# Patient Record
Sex: Female | Born: 1945 | Race: White | Hispanic: No | Marital: Single | Smoking: Former smoker
Health system: Southern US, Community
[De-identification: ages and names within clinical notes are randomized; demographics above are authoritative.]

## PROBLEM LIST (undated history)

## (undated) DIAGNOSIS — K219 Gastro-esophageal reflux disease without esophagitis: Secondary | ICD-10-CM

## (undated) DIAGNOSIS — I499 Cardiac arrhythmia, unspecified: Secondary | ICD-10-CM

## (undated) DIAGNOSIS — C801 Malignant (primary) neoplasm, unspecified: Secondary | ICD-10-CM

## (undated) DIAGNOSIS — M1990 Unspecified osteoarthritis, unspecified site: Secondary | ICD-10-CM

## (undated) DIAGNOSIS — T8859XA Other complications of anesthesia, initial encounter: Secondary | ICD-10-CM

## (undated) DIAGNOSIS — E079 Disorder of thyroid, unspecified: Secondary | ICD-10-CM

## (undated) DIAGNOSIS — T4145XA Adverse effect of unspecified anesthetic, initial encounter: Secondary | ICD-10-CM

## (undated) DIAGNOSIS — H409 Unspecified glaucoma: Secondary | ICD-10-CM

## (undated) DIAGNOSIS — G709 Myoneural disorder, unspecified: Secondary | ICD-10-CM

## (undated) DIAGNOSIS — E041 Nontoxic single thyroid nodule: Secondary | ICD-10-CM

## (undated) DIAGNOSIS — I10 Essential (primary) hypertension: Secondary | ICD-10-CM

## (undated) HISTORY — DX: Disorder of thyroid, unspecified: E07.9

## (undated) HISTORY — DX: Gastro-esophageal reflux disease without esophagitis: K21.9

## (undated) HISTORY — PX: COLONOSCOPY: SHX174

## (undated) HISTORY — DX: Nontoxic single thyroid nodule: E04.1

## (undated) HISTORY — DX: Unspecified osteoarthritis, unspecified site: M19.90

## (undated) HISTORY — DX: Essential (primary) hypertension: I10

---
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
## 1999-02-25 ENCOUNTER — Encounter: Payer: Self-pay | Admitting: Obstetrics & Gynecology

## 1999-02-25 ENCOUNTER — Ambulatory Visit (HOSPITAL_COMMUNITY): Admission: RE | Admit: 1999-02-25 | Discharge: 1999-02-25 | Payer: Self-pay | Admitting: Obstetrics & Gynecology

## 1999-03-08 ENCOUNTER — Other Ambulatory Visit: Admission: RE | Admit: 1999-03-08 | Discharge: 1999-03-08 | Payer: Self-pay | Admitting: Obstetrics & Gynecology

## 1999-09-01 ENCOUNTER — Encounter: Admission: RE | Admit: 1999-09-01 | Discharge: 1999-09-01 | Payer: Self-pay | Admitting: Obstetrics & Gynecology

## 1999-09-01 ENCOUNTER — Encounter: Payer: Self-pay | Admitting: Obstetrics & Gynecology

## 2001-03-20 ENCOUNTER — Emergency Department (HOSPITAL_COMMUNITY): Admission: EM | Admit: 2001-03-20 | Discharge: 2001-03-20 | Payer: Self-pay | Admitting: Emergency Medicine

## 2001-03-20 ENCOUNTER — Encounter: Payer: Self-pay | Admitting: Emergency Medicine

## 2004-01-21 ENCOUNTER — Encounter: Admission: RE | Admit: 2004-01-21 | Discharge: 2004-01-21 | Payer: Self-pay | Admitting: Endocrinology

## 2004-08-29 ENCOUNTER — Encounter: Admission: RE | Admit: 2004-08-29 | Discharge: 2004-08-29 | Payer: Self-pay | Admitting: Orthopaedic Surgery

## 2004-09-02 ENCOUNTER — Encounter: Admission: RE | Admit: 2004-09-02 | Discharge: 2004-09-02 | Payer: Self-pay | Admitting: Orthopaedic Surgery

## 2004-09-06 ENCOUNTER — Encounter: Admission: RE | Admit: 2004-09-06 | Discharge: 2004-09-06 | Payer: Self-pay | Admitting: Obstetrics & Gynecology

## 2004-09-20 ENCOUNTER — Other Ambulatory Visit: Admission: RE | Admit: 2004-09-20 | Discharge: 2004-09-20 | Payer: Self-pay | Admitting: Obstetrics & Gynecology

## 2009-12-31 ENCOUNTER — Encounter: Admission: RE | Admit: 2009-12-31 | Discharge: 2009-12-31 | Payer: Self-pay | Admitting: Endocrinology

## 2010-01-28 ENCOUNTER — Encounter: Admission: RE | Admit: 2010-01-28 | Discharge: 2010-01-28 | Payer: Self-pay | Admitting: Obstetrics & Gynecology

## 2010-02-01 ENCOUNTER — Encounter: Admission: RE | Admit: 2010-02-01 | Discharge: 2010-02-01 | Payer: Self-pay | Admitting: Obstetrics & Gynecology

## 2010-03-16 ENCOUNTER — Encounter (INDEPENDENT_AMBULATORY_CARE_PROVIDER_SITE_OTHER): Payer: Self-pay | Admitting: Obstetrics and Gynecology

## 2010-03-16 ENCOUNTER — Inpatient Hospital Stay (HOSPITAL_COMMUNITY): Admission: RE | Admit: 2010-03-16 | Discharge: 2010-03-19 | Payer: Self-pay | Admitting: Obstetrics and Gynecology

## 2010-03-24 HISTORY — PX: ABDOMINAL HYSTERECTOMY: SHX81

## 2010-08-14 ENCOUNTER — Encounter: Payer: Self-pay | Admitting: Orthopaedic Surgery

## 2010-09-19 ENCOUNTER — Other Ambulatory Visit: Payer: Self-pay | Admitting: Family Medicine

## 2010-09-19 DIAGNOSIS — E049 Nontoxic goiter, unspecified: Secondary | ICD-10-CM

## 2010-09-19 DIAGNOSIS — R10811 Right upper quadrant abdominal tenderness: Secondary | ICD-10-CM

## 2010-09-22 ENCOUNTER — Ambulatory Visit
Admission: RE | Admit: 2010-09-22 | Discharge: 2010-09-22 | Disposition: A | Discharge status: Home / Self Care | Payer: BC Managed Care – PPO | Source: Ambulatory Visit | Attending: Family Medicine | Admitting: Family Medicine

## 2010-09-22 DIAGNOSIS — R10811 Right upper quadrant abdominal tenderness: Secondary | ICD-10-CM

## 2010-09-22 DIAGNOSIS — E049 Nontoxic goiter, unspecified: Secondary | ICD-10-CM

## 2010-09-28 ENCOUNTER — Other Ambulatory Visit: Payer: Self-pay | Admitting: Family Medicine

## 2010-09-28 DIAGNOSIS — E041 Nontoxic single thyroid nodule: Secondary | ICD-10-CM

## 2010-10-07 LAB — COMPREHENSIVE METABOLIC PANEL
ALT: 20 U/L (ref 0–35)
AST: 22 U/L (ref 0–37)
Albumin: 3.4 g/dL — ABNORMAL LOW (ref 3.5–5.2)
Alkaline Phosphatase: 50 U/L (ref 39–117)
Creatinine, Ser: 0.77 mg/dL (ref 0.4–1.2)
GFR calc Af Amer: >60 mL/min (ref >60)
GFR calc non Af Amer: >60 mL/min (ref >60)
Potassium: 4.6 mEq/L (ref 3.5–5.1)
Sodium: 141 mEq/L (ref 135–145)
Total Bilirubin: 1 mg/dL (ref 0.3–1.2)

## 2010-10-07 LAB — CBC
HCT: 33.6 % — ABNORMAL LOW (ref 36.0–46.0)
HCT: 35.5 % — ABNORMAL LOW (ref 36.0–46.0)
HCT: 41.5 % (ref 36.0–46.0)
Hemoglobin: 11.3 g/dL — ABNORMAL LOW (ref 12.0–15.0)
Hemoglobin: 13.6 g/dL (ref 12.0–15.0)
MCHC: 32.9 g/dL (ref 30.0–36.0)
MCHC: 33.8 g/dL (ref 30.0–36.0)
MCV: 90.6 fL (ref 78.0–100.0)
Platelets: 224 10*3/uL (ref 150–400)
RBC: 3.71 MIL/uL — ABNORMAL LOW (ref 3.87–5.11)
RBC: 3.93 MIL/uL (ref 3.87–5.11)
WBC: 4.9 10*3/uL (ref 4.0–10.5)

## 2010-10-07 LAB — HEMOGLOBIN AND HEMATOCRIT, BLOOD: Hemoglobin: 13.5 g/dL (ref 12.0–15.0)

## 2010-10-07 LAB — SURGICAL PCR SCREEN: MRSA, PCR: NEGATIVE

## 2010-10-11 ENCOUNTER — Other Ambulatory Visit (HOSPITAL_COMMUNITY)
Admission: RE | Admit: 2010-10-11 | Discharge: 2010-10-11 | Disposition: A | Discharge status: Home / Self Care | Payer: BC Managed Care – PPO | Source: Ambulatory Visit | Attending: Interventional Radiology | Admitting: Interventional Radiology

## 2010-10-11 ENCOUNTER — Ambulatory Visit
Admission: RE | Admit: 2010-10-11 | Discharge: 2010-10-11 | Disposition: A | Discharge status: Home / Self Care | Payer: BC Managed Care – PPO | Source: Ambulatory Visit | Attending: Family Medicine | Admitting: Family Medicine

## 2010-10-11 ENCOUNTER — Other Ambulatory Visit: Payer: Self-pay | Admitting: Interventional Radiology

## 2010-10-11 DIAGNOSIS — E049 Nontoxic goiter, unspecified: Secondary | ICD-10-CM | POA: Insufficient documentation

## 2010-10-11 DIAGNOSIS — E041 Nontoxic single thyroid nodule: Secondary | ICD-10-CM

## 2011-12-12 ENCOUNTER — Other Ambulatory Visit: Payer: Self-pay | Admitting: Obstetrics & Gynecology

## 2011-12-12 DIAGNOSIS — N6452 Nipple discharge: Secondary | ICD-10-CM

## 2011-12-20 ENCOUNTER — Other Ambulatory Visit: Payer: Self-pay | Admitting: Obstetrics & Gynecology

## 2011-12-20 ENCOUNTER — Ambulatory Visit
Admission: RE | Admit: 2011-12-20 | Discharge: 2011-12-20 | Disposition: A | Discharge status: Home / Self Care | Payer: BC Managed Care – PPO | Source: Ambulatory Visit | Attending: Obstetrics & Gynecology | Admitting: Obstetrics & Gynecology

## 2011-12-20 DIAGNOSIS — N6452 Nipple discharge: Secondary | ICD-10-CM

## 2011-12-21 ENCOUNTER — Encounter (INDEPENDENT_AMBULATORY_CARE_PROVIDER_SITE_OTHER): Payer: Self-pay | Admitting: General Surgery

## 2011-12-21 ENCOUNTER — Ambulatory Visit (INDEPENDENT_AMBULATORY_CARE_PROVIDER_SITE_OTHER): Payer: BC Managed Care – PPO | Admitting: General Surgery

## 2011-12-21 VITALS — BP 150/86 | HR 77 | Temp 97.7°F | Ht 66.0 in | Wt 160.0 lb

## 2011-12-21 DIAGNOSIS — N6459 Other signs and symptoms in breast: Secondary | ICD-10-CM

## 2011-12-21 DIAGNOSIS — N6452 Nipple discharge: Secondary | ICD-10-CM | POA: Insufficient documentation

## 2011-12-21 NOTE — Progress Notes (Signed)
Patient ID: Susan Ali, female   DOB: 04/09/1946, 65 y.o.   MRN: [id number]  Chief Complaint  Patient presents with  . Pre-op Exam    eval br    HPI Susan Ali is a 65 y.o. female.  Referred by Dr. Eagle HPI 65 yof who is otherwise healthy and presents with several episodes over months with dark spots noted on her nightgown and her sheets.  This has been brownish fluid.  She has no masses present and no prior history of any breast complaints.  She underwent attempted ductogram and mmg and is referred for evaluation.  Past Medical History  Diagnosis Date  . Arthritis   . Hypertension   . Thyroid disease     Past Surgical History  Procedure Date  . Knee surgery 1967    right  . Abdominal hysterectomy 03/2010    Family History  Problem Relation Age of Onset  . Stroke Mother   . Hypertension Father     Social History History  Substance Use Topics  . Smoking status: Former Smoker  . Smokeless tobacco: Former User    Quit date: 12/21/1991  . Alcohol Use: No    No Known Allergies  Current Outpatient Prescriptions  Medication Sig Dispense Refill  . Multiple Vitamin (MULTIVITAMIN) capsule Take 1 capsule by mouth daily.      . ramipril (ALTACE) 10 MG capsule Take 10 mg by mouth daily.      . timolol (TIMOPTIC) 0.5 % ophthalmic solution Place 1 drop into both eyes daily.        Review of Systems Review of Systems  Constitutional: Negative for fever, chills and unexpected weight change.  HENT: Negative for hearing loss, congestion, sore throat, trouble swallowing and voice change.   Eyes: Negative for visual disturbance.  Respiratory: Negative for cough and wheezing.   Cardiovascular: Negative for chest pain, palpitations and leg swelling.  Gastrointestinal: Negative for nausea, vomiting, abdominal pain, diarrhea, constipation, blood in stool, abdominal distention and anal bleeding.  Genitourinary: Negative for hematuria, vaginal bleeding and difficulty  urinating.  Musculoskeletal: Negative for arthralgias.  Skin: Negative for rash and wound.  Neurological: Negative for seizures, syncope and headaches.  Hematological: Negative for adenopathy. Does not bruise/bleed easily.  Psychiatric/Behavioral: Negative for confusion.    Blood pressure 150/86, pulse 77, temperature 97.7 F (36.5 C), temperature source Temporal, height 5' 6" (1.676 m), weight 160 lb (72.576 kg), SpO2 97.00%.  Physical Exam Physical Exam  Vitals reviewed. Constitutional: She appears well-developed and well-nourished.  Neck: Neck supple. Thyromegaly present.  Cardiovascular: Normal rate, regular rhythm and normal heart sounds.   Pulmonary/Chest: Effort normal and breath sounds normal. She has no wheezes. She has no rales. Right breast exhibits nipple discharge (dark nipple dc from single duct). Right breast exhibits no inverted nipple, no mass, no skin change and no tenderness. Left breast exhibits no inverted nipple, no mass, no nipple discharge, no skin change and no tenderness. Breasts are symmetrical.  Lymphadenopathy:    She has no cervical adenopathy.    She has no axillary adenopathy.       Right: No supraclavicular adenopathy present.       Left: No supraclavicular adenopathy present.    Data Reviewed DIGITAL DIAGNOSTIC BILATERAL MAMMOGRAM WITH CAD AND RIGHT DUCTOGRAM  [facility]  Findings: There are scattered fibroglandular densities. There is  no suspicious dominant mass, architectural distortion or  calcification to   suggest malignancy. Previously noted cyst in the  right anterior upper outer quadrant has decreased in size as  compared to prior study.  Mammographic images were processed with CAD.  On physical examination, there is a dark yellow to slightly blood  tinged to brown spontaneous discharge arising from a duct in the  lower inner quadrant of the  right nipple.  Galactography is performed using a 30 gauge probe and 0.2 ccs of  Omnipaque. Images demonstrate a 1mm slightly irregular filling  defect (not as smooth as one would expect for an air bubble)  approximately 18 mm deep to the nipple within the discharging duct.  The wall of the duct is slightly irregular approximately 32 mm deep  to the nipple which is a concerning finding. Surgical consultation  for consideration of excision is recommended.  IMPRESSION:  Small filling defect in the discharging duct approximately 18 mm  deep to the right nipple with slight irregularity of the wall of  the same duct approximately 32 mm deep to the nipple. Surgical  consultation with consideration of excision is recommended.  Surgical consultation was scheduled with Dr. Darnell Jeschke for  12/21/2011.  BI-RADS CATEGORY 4: Suspicious abnormality - biopsy should be  considered.   Assessment    Right breast nipple discharge    Plan    She has unilateral, spontaneous dark discharge with abnl ductogram.  We discussed a right breast duct excision.  Risks include bleeding, infection or further surgery.        Haja Crego 12/21/2011, 2:28 PM    

## 2012-01-11 ENCOUNTER — Encounter (HOSPITAL_BASED_OUTPATIENT_CLINIC_OR_DEPARTMENT_OTHER): Payer: Self-pay | Admitting: *Deleted

## 2012-01-11 NOTE — Progress Notes (Signed)
To come in for labs and ekg 

## 2012-01-12 ENCOUNTER — Other Ambulatory Visit: Payer: Self-pay

## 2012-01-12 ENCOUNTER — Encounter (HOSPITAL_BASED_OUTPATIENT_CLINIC_OR_DEPARTMENT_OTHER)
Admission: RE | Admit: 2012-01-12 | Discharge: 2012-01-12 | Disposition: A | Discharge status: Home / Self Care | Payer: BC Managed Care – PPO | Source: Ambulatory Visit | Attending: General Surgery | Admitting: General Surgery

## 2012-01-12 LAB — BASIC METABOLIC PANEL
CO2: 27 mEq/L (ref 19–32)
Calcium: 9.5 mg/dL (ref 8.4–10.5)
Potassium: 3.9 mEq/L (ref 3.5–5.1)
Sodium: 141 mEq/L (ref 135–145)

## 2012-01-12 LAB — CBC
MCV: 88.5 fL (ref 78.0–100.0)
Platelets: 253 10*3/uL (ref 150–400)
RBC: 4.27 MIL/uL (ref 3.87–5.11)
WBC: 4.2 10*3/uL (ref 4.0–10.5)

## 2012-01-17 ENCOUNTER — Encounter (HOSPITAL_BASED_OUTPATIENT_CLINIC_OR_DEPARTMENT_OTHER): Payer: Self-pay | Admitting: Anesthesiology

## 2012-01-17 ENCOUNTER — Ambulatory Visit (HOSPITAL_BASED_OUTPATIENT_CLINIC_OR_DEPARTMENT_OTHER): Payer: BC Managed Care – PPO | Admitting: Anesthesiology

## 2012-01-17 ENCOUNTER — Encounter (HOSPITAL_BASED_OUTPATIENT_CLINIC_OR_DEPARTMENT_OTHER): Payer: Self-pay

## 2012-01-17 ENCOUNTER — Ambulatory Visit (HOSPITAL_BASED_OUTPATIENT_CLINIC_OR_DEPARTMENT_OTHER)
Admission: RE | Admit: 2012-01-17 | Discharge: 2012-01-17 | Disposition: A | Discharge status: Home / Self Care | Payer: BC Managed Care – PPO | Source: Ambulatory Visit | Attending: General Surgery | Admitting: General Surgery

## 2012-01-17 ENCOUNTER — Encounter (HOSPITAL_BASED_OUTPATIENT_CLINIC_OR_DEPARTMENT_OTHER)
Admission: RE | Disposition: A | Discharge status: Home / Self Care | Payer: Self-pay | Source: Ambulatory Visit | Attending: General Surgery

## 2012-01-17 DIAGNOSIS — M129 Arthropathy, unspecified: Secondary | ICD-10-CM | POA: Insufficient documentation

## 2012-01-17 DIAGNOSIS — N6459 Other signs and symptoms in breast: Secondary | ICD-10-CM

## 2012-01-17 DIAGNOSIS — Z01810 Encounter for preprocedural cardiovascular examination: Secondary | ICD-10-CM | POA: Insufficient documentation

## 2012-01-17 DIAGNOSIS — Z01812 Encounter for preprocedural laboratory examination: Secondary | ICD-10-CM | POA: Insufficient documentation

## 2012-01-17 DIAGNOSIS — I10 Essential (primary) hypertension: Secondary | ICD-10-CM | POA: Insufficient documentation

## 2012-01-17 DIAGNOSIS — N6089 Other benign mammary dysplasias of unspecified breast: Secondary | ICD-10-CM | POA: Insufficient documentation

## 2012-01-17 HISTORY — PX: BREAST DUCTAL SYSTEM EXCISION: SHX5242

## 2012-01-17 SURGERY — EXCISION DUCTAL SYSTEM BREAST
Anesthesia: General | Site: Breast | Laterality: Right | Wound class: Clean

## 2012-01-17 MED ORDER — BUPIVACAINE HCL (PF) 0.25 % IJ SOLN
INTRAMUSCULAR | Status: DC | PRN
  Administered 2012-01-17: 10 mL

## 2012-01-17 MED ORDER — LACTATED RINGERS IV SOLN
INTRAVENOUS | Status: DC
  Administered 2012-01-17: 10 mL/h via INTRAVENOUS

## 2012-01-17 MED ORDER — FENTANYL CITRATE 0.05 MG/ML IJ SOLN: 25.0000 ug | INTRAMUSCULAR | Status: DC | PRN

## 2012-01-17 MED ORDER — OXYCODONE-ACETAMINOPHEN 5-325 MG PO TABS: 1.0000 | ORAL_TABLET | ORAL | Status: AC | PRN

## 2012-01-17 MED ORDER — LACTATED RINGERS IV SOLN
INTRAVENOUS | Status: DC | PRN
  Administered 2012-01-17 (×2): via INTRAVENOUS

## 2012-01-17 MED ORDER — ACETAMINOPHEN 10 MG/ML IV SOLN
1000.0000 mg | Freq: Once | INTRAVENOUS | Status: AC
  Administered 2012-01-17: 1000 mg via INTRAVENOUS

## 2012-01-17 MED ORDER — LIDOCAINE HCL (CARDIAC) 20 MG/ML IV SOLN
INTRAVENOUS | Status: DC | PRN
  Administered 2012-01-17: 50 mg via INTRAVENOUS

## 2012-01-17 MED ORDER — PROPOFOL 10 MG/ML IV EMUL
INTRAVENOUS | Status: DC | PRN
  Administered 2012-01-17: 200 mg via INTRAVENOUS

## 2012-01-17 MED ORDER — ONDANSETRON HCL 4 MG/2ML IJ SOLN
INTRAMUSCULAR | Status: DC | PRN
  Administered 2012-01-17: 4 mg via INTRAVENOUS

## 2012-01-17 MED ORDER — DEXAMETHASONE SODIUM PHOSPHATE 4 MG/ML IJ SOLN
INTRAMUSCULAR | Status: DC | PRN
  Administered 2012-01-17: 10 mg via INTRAVENOUS

## 2012-01-17 MED ORDER — METOCLOPRAMIDE HCL 5 MG/ML IJ SOLN
INTRAMUSCULAR | Status: DC | PRN
  Administered 2012-01-17: 10 mg via INTRAVENOUS

## 2012-01-17 MED ORDER — MIDAZOLAM HCL 5 MG/5ML IJ SOLN
INTRAMUSCULAR | Status: DC | PRN
  Administered 2012-01-17: 2 mg via INTRAVENOUS

## 2012-01-17 MED ORDER — OXYCODONE HCL 5 MG PO TABS: 5.0000 mg | ORAL_TABLET | Freq: Once | ORAL | Status: DC | PRN

## 2012-01-17 MED ORDER — METOCLOPRAMIDE HCL 5 MG/ML IJ SOLN: 10.0000 mg | Freq: Once | INTRAMUSCULAR | Status: DC | PRN

## 2012-01-17 MED ORDER — FENTANYL CITRATE 0.05 MG/ML IJ SOLN
INTRAMUSCULAR | Status: DC | PRN
  Administered 2012-01-17: 50 ug via INTRAVENOUS

## 2012-01-17 SURGICAL SUPPLY — 52 items
ADH SKN CLS APL DERMABOND .7 (GAUZE/BANDAGES/DRESSINGS)
APL SKNCLS STERI-STRIP NONHPOA (GAUZE/BANDAGES/DRESSINGS) ×1
APPLIER CLIP 9.375 MED OPEN (MISCELLANEOUS)
APR CLP MED 9.3 20 MLT OPN (MISCELLANEOUS)
BENZOIN TINCTURE PRP APPL 2/3 (GAUZE/BANDAGES/DRESSINGS) ×2 IMPLANT
BINDER BREAST LRG (GAUZE/BANDAGES/DRESSINGS) IMPLANT
BINDER BREAST MEDIUM (GAUZE/BANDAGES/DRESSINGS) IMPLANT
BINDER BREAST XLRG (GAUZE/BANDAGES/DRESSINGS) IMPLANT
BINDER BREAST XXLRG (GAUZE/BANDAGES/DRESSINGS) IMPLANT
BLADE SURG 15 STRL LF DISP TIS (BLADE) ×1 IMPLANT
BLADE SURG 15 STRL SS (BLADE) ×2
CANISTER SUCTION 1200CC (MISCELLANEOUS) IMPLANT
CHLORAPREP W/TINT 26ML (MISCELLANEOUS) ×2 IMPLANT
CLIP APPLIE 9.375 MED OPEN (MISCELLANEOUS) IMPLANT
CLOTH BEACON ORANGE TIMEOUT ST (SAFETY) ×2 IMPLANT
COVER MAYO STAND STRL (DRAPES) ×2 IMPLANT
COVER TABLE BACK 60X90 (DRAPES) ×2 IMPLANT
DECANTER SPIKE VIAL GLASS SM (MISCELLANEOUS) IMPLANT
DERMABOND ADVANCED (GAUZE/BANDAGES/DRESSINGS)
DERMABOND ADVANCED .7 DNX12 (GAUZE/BANDAGES/DRESSINGS) IMPLANT
DEVICE DUBIN W/COMP PLATE 8390 (MISCELLANEOUS) IMPLANT
DRAPE PED LAPAROTOMY (DRAPES) ×2 IMPLANT
DRSG TEGADERM 4X4.75 (GAUZE/BANDAGES/DRESSINGS) ×2 IMPLANT
ELECT COATED BLADE 2.86 ST (ELECTRODE) ×2 IMPLANT
ELECT REM PT RETURN 9FT ADLT (ELECTROSURGICAL) ×2
ELECTRODE REM PT RTRN 9FT ADLT (ELECTROSURGICAL) ×1 IMPLANT
GAUZE SPONGE 4X4 12PLY STRL LF (GAUZE/BANDAGES/DRESSINGS) ×2 IMPLANT
GLOVE BIO SURGEON STRL SZ7 (GLOVE) ×3 IMPLANT
GLOVE BIOGEL PI IND STRL 7.5 (GLOVE) ×1 IMPLANT
GLOVE BIOGEL PI INDICATOR 7.5 (GLOVE) ×1
GOWN PREVENTION PLUS XLARGE (GOWN DISPOSABLE) ×2 IMPLANT
NDL HYPO 25X1 1.5 SAFETY (NEEDLE) ×1 IMPLANT
NEEDLE HYPO 25X1 1.5 SAFETY (NEEDLE) ×2 IMPLANT
NS IRRIG 1000ML POUR BTL (IV SOLUTION) IMPLANT
PACK BASIN DAY SURGERY FS (CUSTOM PROCEDURE TRAY) ×2 IMPLANT
PENCIL BUTTON HOLSTER BLD 10FT (ELECTRODE) ×2 IMPLANT
SLEEVE SCD COMPRESS KNEE MED (MISCELLANEOUS) ×2 IMPLANT
SPONGE LAP 4X18 X RAY DECT (DISPOSABLE) ×2 IMPLANT
STRIP CLOSURE SKIN 1/2X4 (GAUZE/BANDAGES/DRESSINGS) ×2 IMPLANT
SUT MNCRL AB 4-0 PS2 18 (SUTURE) ×2 IMPLANT
SUT MON AB 5-0 PS2 18 (SUTURE) ×1 IMPLANT
SUT SILK 2 0 SH (SUTURE) ×2 IMPLANT
SUT VIC AB 2-0 SH 27 (SUTURE) ×2
SUT VIC AB 2-0 SH 27XBRD (SUTURE) ×1 IMPLANT
SUT VIC AB 3-0 SH 27 (SUTURE) ×2
SUT VIC AB 3-0 SH 27X BRD (SUTURE) ×1 IMPLANT
SYR CONTROL 10ML LL (SYRINGE) ×2 IMPLANT
TOWEL OR 17X24 6PK STRL BLUE (TOWEL DISPOSABLE) ×2 IMPLANT
TOWEL OR NON WOVEN STRL DISP B (DISPOSABLE) ×2 IMPLANT
TUBE CONNECTING 20X1/4 (TUBING) IMPLANT
WATER STERILE IRR 1000ML POUR (IV SOLUTION) ×2 IMPLANT
YANKAUER SUCT BULB TIP NO VENT (SUCTIONS) IMPLANT

## 2012-01-17 NOTE — H&P (View-Only) (Signed)
Patient ID: Susan Ali, female   DOB: [birth year], 66 y.o.   MRN: [id number]  Chief Complaint  Patient presents with  . Pre-op Exam    eval br    HPI Susan MORSCH is a 66 y.o. female.  Referred by Dr. Deboraha Sprang HPI 62 yof who is otherwise healthy and presents with several episodes over months with dark spots noted on her nightgown and her sheets.  This has been brownish fluid.  She has no masses present and no prior history of any breast complaints.  She underwent attempted ductogram and mmg and is referred for evaluation.  Past Medical History  Diagnosis Date  . Arthritis   . Hypertension   . Thyroid disease     Past Surgical History  Procedure Date  . Knee surgery 1967    right  . Abdominal hysterectomy 03/2010    Family History  Problem Relation Age of Onset  . Stroke Mother   . Hypertension Father     Social History History  Substance Use Topics  . Smoking status: Former Games developer  . Smokeless tobacco: Former Neurosurgeon    Quit date: 12/21/1991  . Alcohol Use: No    No Known Allergies  Current Outpatient Prescriptions  Medication Sig Dispense Refill  . Multiple Vitamin (MULTIVITAMIN) capsule Take 1 capsule by mouth daily.      . ramipril (ALTACE) 10 MG capsule Take 10 mg by mouth daily.      . timolol (TIMOPTIC) 0.5 % ophthalmic solution Place 1 drop into both eyes daily.        Review of Systems Review of Systems  Constitutional: Negative for fever, chills and unexpected weight change.  HENT: Negative for hearing loss, congestion, sore throat, trouble swallowing and voice change.   Eyes: Negative for visual disturbance.  Respiratory: Negative for cough and wheezing.   Cardiovascular: Negative for chest pain, palpitations and leg swelling.  Gastrointestinal: Negative for nausea, vomiting, abdominal pain, diarrhea, constipation, blood in stool, abdominal distention and anal bleeding.  Genitourinary: Negative for hematuria, vaginal bleeding and difficulty  urinating.  Musculoskeletal: Negative for arthralgias.  Skin: Negative for rash and wound.  Neurological: Negative for seizures, syncope and headaches.  Hematological: Negative for adenopathy. Does not bruise/bleed easily.  Psychiatric/Behavioral: Negative for confusion.    Blood pressure 150/86, pulse 77, temperature 97.7 F (36.5 C), temperature source Temporal, height 5\' 6"  (1.676 m), weight 160 lb (72.576 kg), SpO2 97.00%.  Physical Exam Physical Exam  Vitals reviewed. Constitutional: She appears well-developed and well-nourished.  Neck: Neck supple. Thyromegaly present.  Cardiovascular: Normal rate, regular rhythm and normal heart sounds.   Pulmonary/Chest: Effort normal and breath sounds normal. She has no wheezes. She has no rales. Right breast exhibits nipple discharge (dark nipple dc from single duct). Right breast exhibits no inverted nipple, no mass, no skin change and no tenderness. Left breast exhibits no inverted nipple, no mass, no nipple discharge, no skin change and no tenderness. Breasts are symmetrical.  Lymphadenopathy:    She has no cervical adenopathy.    She has no axillary adenopathy.       Right: No supraclavicular adenopathy present.       Left: No supraclavicular adenopathy present.    Data Reviewed DIGITAL DIAGNOSTIC BILATERAL MAMMOGRAM WITH CAD AND RIGHT DUCTOGRAM  [facility]: [facility].  Findings: There are scattered fibroglandular densities. There is  no suspicious dominant mass, architectural distortion or  calcification to  suggest malignancy. Previously noted cyst in the  right anterior upper outer quadrant has decreased in size as  compared to prior study.  Mammographic images were processed with CAD.  On physical examination, there is a dark yellow to slightly blood  tinged to brown spontaneous discharge arising from a duct in the  lower inner quadrant of the  right nipple.  Galactography is performed using a 30 gauge probe and 0.2 ccs of  Omnipaque. Images demonstrate a 1mm slightly irregular filling  defect (not as smooth as one would expect for an air bubble)  approximately 18 mm deep to the nipple within the discharging duct.  The wall of the duct is slightly irregular approximately 32 mm deep  to the nipple which is a concerning finding. Surgical consultation  for consideration of excision is recommended.  IMPRESSION:  Small filling defect in the discharging duct approximately 18 mm  deep to the right nipple with slight irregularity of the wall of  the same duct approximately 32 mm deep to the nipple. Surgical  consultation with consideration of excision is recommended.  Surgical consultation was scheduled with Dr. Dwain Sarna for  12/21/2011.  BI-RADS CATEGORY 4: Suspicious abnormality - biopsy should be  considered.   Assessment    Right breast nipple discharge    Plan    She has unilateral, spontaneous dark discharge with abnl ductogram.  We discussed a right breast duct excision.  Risks include bleeding, infection or further surgery.        Susan Ali 12/21/2011, 2:28 PM

## 2012-01-17 NOTE — Anesthesia Preprocedure Evaluation (Signed)
Anesthesia Evaluation  Patient identified by MRN, date of birth, ID band Patient awake    Reviewed: Allergy & Precautions, H&P , NPO status , Patient's Chart, lab work & pertinent test results, reviewed documented beta blocker date and time   Airway Mallampati: II TM Distance: >3 FB Neck ROM: full    Dental   Pulmonary neg pulmonary ROS,          Cardiovascular hypertension, On Medications negative cardio ROS      Neuro/Psych negative neurological ROS  negative psych ROS   GI/Hepatic negative GI ROS, Neg liver ROS,   Endo/Other  negative endocrine ROS  Renal/GU negative Renal ROS  negative genitourinary   Musculoskeletal   Abdominal   Peds  Hematology negative hematology ROS (+)   Anesthesia Other Findings See surgeon's H&P   Reproductive/Obstetrics negative OB ROS                           Anesthesia Physical Anesthesia Plan  ASA: II  Anesthesia Plan: General   Post-op Pain Management:    Induction: Intravenous  Airway Management Planned: LMA  Additional Equipment:   Intra-op Plan:   Post-operative Plan: Extubation in OR  Informed Consent: I have reviewed the patients History and Physical, chart, labs and discussed the procedure including the risks, benefits and alternatives for the proposed anesthesia with the patient or authorized representative who has indicated his/her understanding and acceptance.   Dental Advisory Given  Plan Discussed with: CRNA and Surgeon  Anesthesia Plan Comments:         Anesthesia Quick Evaluation

## 2012-01-17 NOTE — Interval H&P Note (Signed)
History and Physical Interval Note:  01/17/2012 11:58 AM  Susan Ali  has presented today for surgery, with the diagnosis of Right breast nipple discharge  The various methods of treatment have been discussed with the patient and family. After consideration of risks, benefits and other options for treatment, the patient has consented to  Procedure(s) (LRB): EXCISION DUCTAL SYSTEM BREAST (Right) as a surgical intervention .  The patient's history has been reviewed, patient examined, no change in status, stable for surgery.  I have reviewed the patients' chart and labs.  Questions were answered to the patient's satisfaction.     [name]

## 2012-01-17 NOTE — Transfer of Care (Signed)
Immediate Anesthesia Transfer of Care Note  Patient: Susan Ali  Procedure(s) Performed: Procedure(s) (LRB): EXCISION DUCTAL SYSTEM BREAST (Right)  Patient Location: PACU  Anesthesia Type: General  Level of Consciousness: sedated  Airway & Oxygen Therapy: Patient Spontanous Breathing and Patient connected to face mask oxygen  Post-op Assessment: Report given to PACU RN and Post -op Vital signs reviewed and stable  Post vital signs: Reviewed and stable  Complications: No apparent anesthesia complications

## 2012-01-17 NOTE — Anesthesia Postprocedure Evaluation (Signed)
Anesthesia Post Note  Patient: Susan Ali  Procedure(s) Performed: Procedure(s) (LRB): EXCISION DUCTAL SYSTEM BREAST (Right)  Anesthesia type: General  Patient location: PACU  Post pain: Pain level controlled  Post assessment: Patient's Cardiovascular Status Stable  Last Vitals:  Filed Vitals:   01/17/12 1357  BP: 138/74  Pulse:   Temp: 36.3 C  Resp: 16    Post vital signs: Reviewed and stable  Level of consciousness: alert  Complications: No apparent anesthesia complications

## 2012-01-17 NOTE — Discharge Instructions (Signed)
[facility] °Office Phone Number [phone] ° °BREAST BIOPSY/ PARTIAL MASTECTOMY: POST OP INSTRUCTIONS ° °Always review your discharge instruction sheet given to you by the facility where your surgery was performed. ° °IF YOU HAVE DISABILITY OR FAMILY LEAVE FORMS, YOU MUST BRING THEM TO THE OFFICE FOR PROCESSING.  DO NOT GIVE THEM TO YOUR DOCTOR. ° °1. A prescription for pain medication may be given to you upon discharge.  Take your pain medication as prescribed, if needed.  If narcotic pain medicine is not needed, then you may take acetaminophen (Tylenol), naprosyn (Alleve) or ibuprofen (Advil) as needed. °2. Take your usually prescribed medications unless otherwise directed °3. If you need a refill on your pain medication, please contact your pharmacy.  They will contact our office to request authorization.  Prescriptions will not be filled after 5pm or on week-ends. °4. You should eat very light the first 24 hours after surgery, such as soup, crackers, pudding, etc.  Resume your normal diet the day after surgery. °5. Most patients will experience some swelling and bruising in the breast.  Ice packs and a good support bra will help.  Wear the breast binder provided or a sports bra for 72 hours day and night.  After that wear a sports bra during the day until you return to the office. Swelling and bruising can take several days to resolve.  °6. It is common to experience some constipation if taking pain medication after surgery.  Increasing fluid intake and taking a stool softener will usually help or prevent this problem from occurring.  A mild laxative (Milk of Magnesia or Miralax) should be taken according to package directions if there are no bowel movements after 48 hours. °7. Unless discharge instructions indicate otherwise, you may remove your bandages 48 hours after surgery and you may shower at that time.  You may have steri-strips (small skin tapes) in place directly over the incision.   These strips should be left on the skin for 7-10 days and will come off on their own.  If your surgeon used skin glue on the incision, you may shower in 24 hours.  The glue will flake off over the next 2-3 weeks.  Any sutures or staples will be removed at the office during your follow-up visit. °8. ACTIVITIES:  You may resume regular daily activities (gradually increasing) beginning the next day.  Wearing a good support bra or sports bra minimizes pain and swelling.  You may have sexual intercourse when it is comfortable. °a. You may drive when you no longer are taking prescription pain medication, you can comfortably wear a seatbelt, and you can safely maneuver your car and apply brakes. °b. RETURN TO WORK:  ______________________________________________________________________________________ °9. You should see your doctor in the office for a follow-up appointment approximately two weeks after your surgery.  Your doctor’s nurse will typically make your follow-up appointment when she calls you with your pathology report.  Expect your pathology report 3-4 business days after your surgery.  You may call to check if you do not hear from us after three days. °10. OTHER INSTRUCTIONS: _______________________________________________________________________________________________ _____________________________________________________________________________________________________________________________________ °_____________________________________________________________________________________________________________________________________ °_____________________________________________________________________________________________________________________________________ ° °WHEN TO CALL DR WAKEFIELD: °1. Fever over 101.0 °2. Nausea and/or vomiting. °3. Extreme swelling or bruising. °4. Continued bleeding from incision. °5. Increased pain, redness, or drainage from the incision. ° °The clinic staff is available to  answer your questions during regular business hours.  Please don’t hesitate to call and ask to speak to one of the nurses for   clinical concerns.  If you have a medical emergency, go to the nearest emergency room or call 911.  A surgeon from [facility] is always on call at the hospital. ° °For further questions, please visit [email or web address] ° °Post Anesthesia Home Care Instructions ° °Activity: °Get plenty of rest for the remainder of the day. A responsible adult should stay with you for 24 hours following the procedure.  °For the next 24 hours, DO NOT: °-Drive a car °-Operate machinery °-Drink alcoholic beverages °-Take any medication unless instructed by your physician °-Make any legal decisions or sign important papers. ° °Meals: °Start with liquid foods such as gelatin or soup. Progress to regular foods as tolerated. Avoid greasy, spicy, heavy foods. If nausea and/or vomiting occur, drink only clear liquids until the nausea and/or vomiting subsides. Call your physician if vomiting continues. ° °Special Instructions/Symptoms: °Your throat may feel dry or sore from the anesthesia or the breathing tube placed in your throat during surgery. If this causes discomfort, gargle with warm salt water. The discomfort should disappear within 24 hours. ° °

## 2012-01-17 NOTE — Op Note (Signed)
Preoperative diagnosis: Right nipple discharge Postoperative diagnosis: Same as above Procedure: Right breast central duct excision Surgeon: Dr. Harden Mo Anesthesia: Gen. With LMA Estimated blood loss: Minimal Specimens: Right breast ductal system marked with a short stitch superior, long stitch lateral, double stitch deep Complications: None Drains: None Sponge and needle count correct x2 at end of operation Disposition to recovery in stable condition  Indications: This is a 66 year old female who has had right breast nipple discharge that appeared to be brownish in character. She underwent evaluation at the breast center with a mammogram, ultrasound, and ductogram. There some dilated ducts in her subareolar position as well as what appears to be a filling defect in an irregularity of the duct about 3 cm from the nipple areolar complex. She continues to have spontaneous brown unilateral discharge. I discussed with her going to the operating room and excising this ductal system.  Procedure: After informed consent was obtained the patient was taken to the operating room. She had sequential compression devices placed prior to beginning. She was then placed under general anesthesia with an LMA. Her right breast was prepped and draped in the standard sterile surgical fashion. A surgical timeout was performed.  I made a periareolar incision. I then accessed the clearly discharging single central duct with a lacrimal duct probe. The discharge did appear to be brownish in nature. I then used cautery to dissect down and identify this ductal system. I removed the lacrimal duct probe. I then used cautery to excise this entire ductal system. There was what appeared to be a dilated duct and a small cyst or a mass at the most posterior portion of this. There were no other abnormalities after I removed it. It was marked as above. I then obtained hemostasis. I then closed with 2-0 Vicryl, 3-0 Vicryl, and 5-0  Monocryl. I infiltrated 1% Marcaine throughout the wound. I then placed benzoin Steri-Strips and a sterile dressing. She tolerated this well was extubated and transferred to recovery stable.

## 2012-01-18 ENCOUNTER — Encounter (HOSPITAL_BASED_OUTPATIENT_CLINIC_OR_DEPARTMENT_OTHER): Payer: Self-pay | Admitting: General Surgery

## 2012-01-19 ENCOUNTER — Telehealth (INDEPENDENT_AMBULATORY_CARE_PROVIDER_SITE_OTHER): Payer: Self-pay

## 2012-01-19 NOTE — Telephone Encounter (Signed)
Called pt to notify her that her path report is benign per Dr Dwain Sarna.

## 2012-01-30 ENCOUNTER — Ambulatory Visit (INDEPENDENT_AMBULATORY_CARE_PROVIDER_SITE_OTHER): Payer: BC Managed Care – PPO | Admitting: General Surgery

## 2012-01-30 ENCOUNTER — Encounter (INDEPENDENT_AMBULATORY_CARE_PROVIDER_SITE_OTHER): Payer: Self-pay | Admitting: General Surgery

## 2012-01-30 VITALS — BP 118/76 | HR 74 | Resp 16 | Ht 66.0 in | Wt 156.0 lb

## 2012-01-30 DIAGNOSIS — Z09 Encounter for follow-up examination after completed treatment for conditions other than malignant neoplasm: Secondary | ICD-10-CM

## 2012-01-30 NOTE — Patient Instructions (Signed)

## 2012-01-30 NOTE — Progress Notes (Signed)
Subjective:     Patient ID: Susan Ali, female   DOB: [birth year], 66 y.o.   MRN: [id number]  HPI 21 yof s/p excision of right breast ductal system for abnl ductogram and discharge.  The area was identified easily and excised.  She is doing well without complaints.  She returns today in follow up.  Her path is benign.  We discussed this today and I gave her a copy.  Review of Systems     Objective:   Physical Exam Right breast periareolar incision healing well without infection    Assessment:     S/p right breast duct excision    Plan:     Return to normal activity Return to see me as needed Annual mmg, clinical exam annually and monthly sbe discussed for screening.

## 2012-05-01 ENCOUNTER — Encounter (INDEPENDENT_AMBULATORY_CARE_PROVIDER_SITE_OTHER): Payer: Self-pay | Admitting: General Surgery

## 2012-05-09 ENCOUNTER — Ambulatory Visit (INDEPENDENT_AMBULATORY_CARE_PROVIDER_SITE_OTHER): Payer: Medicare Other | Admitting: Surgery

## 2012-05-09 ENCOUNTER — Encounter (INDEPENDENT_AMBULATORY_CARE_PROVIDER_SITE_OTHER): Payer: Self-pay | Admitting: Surgery

## 2012-05-09 VITALS — BP 144/90 | HR 64 | Temp 97.6°F | Resp 18 | Ht 66.0 in | Wt 149.8 lb

## 2012-05-09 DIAGNOSIS — I10 Essential (primary) hypertension: Secondary | ICD-10-CM

## 2012-05-09 DIAGNOSIS — K648 Other hemorrhoids: Secondary | ICD-10-CM | POA: Insufficient documentation

## 2012-05-09 DIAGNOSIS — E042 Nontoxic multinodular goiter: Secondary | ICD-10-CM

## 2012-05-09 DIAGNOSIS — M5412 Radiculopathy, cervical region: Secondary | ICD-10-CM

## 2012-05-09 DIAGNOSIS — M4802 Spinal stenosis, cervical region: Secondary | ICD-10-CM | POA: Insufficient documentation

## 2012-05-09 DIAGNOSIS — E049 Nontoxic goiter, unspecified: Secondary | ICD-10-CM

## 2012-05-09 NOTE — Progress Notes (Signed)
Chief Complaint:  Large right sided goiter  History of Present Illness:  Susan Ali is an 66 y.o. female who comes in from [facility]'s office having seen Junita Push regarding severe cervical degenerative changes.  She has been having coldness in her left arm for a few years and this was found that worked up by Dr. Clarene Duke who obtained an MRI of her neck. This demonstrated some significant cervical degenerative disease as well as a large right-sided thyroid goiter that has been followed over a number years by Dr. Anson Fret.  She states that she has had at least 2 needle aspirate biopsies which have not shown cancer and this was treated as a goiter. On her MRI she does have some tracheal deviation and on physical exam its predominantly right-sided with very little on the left.  I discussed removal of the right side with maybe some of the left side and then placing her on Synthroid postoperatively. We are doing this to get her ready for have an anterior cervical approach by Dr. Roanna Epley. His concern is that with a goiter as it is now he would be unable to do an anterior approach which would be better for her. I've given her thyroid booklet and her friend was there and I discussed the operation and the risk including recurrent laryngeal nerve palsy as well as hypocalcemia from parathyroid removal. They're aware of the risk and she wants to proceed with thyroidectomy.  Past Medical History  Diagnosis Date  . Arthritis   . Hypertension   . Thyroid disease     goiter-no meds  . GERD (gastroesophageal reflux disease)   . Thyroid nodule     Past Surgical History  Procedure Date  . Knee surgery 1967    right  . Abdominal hysterectomy 03/2010  . Colonoscopy   . Breast ductal system excision 01/17/2012    Procedure: EXCISION DUCTAL SYSTEM BREAST;  Surgeon: Emelia Loron, MD;  Location: [facility];  Service: General;  Laterality: Right;  Right breast duct excision    Current  Outpatient Prescriptions  Medication Sig Dispense Refill  . Multiple Vitamin (MULTIVITAMIN) capsule Take 1 capsule by mouth daily.      . ramipril (ALTACE) 10 MG capsule Take 10 mg by mouth daily.      . timolol (TIMOPTIC) 0.5 % ophthalmic solution Place 1 drop into both eyes daily.       Review of patient's allergies indicates no known allergies. Family History  Problem Relation Age of Onset  . Stroke Mother   . Hypertension Father    Social History:   reports that she quit smoking about 20 years ago. She has never used smokeless tobacco. She reports that she does not drink alcohol or use illicit drugs.   REVIEW OF SYSTEMS - PERTINENT POSITIVES ONLY: No DVT history  Physical Exam:   Blood pressure 144/90, pulse 64, temperature 97.6 F (36.4 C), temperature source Temporal, resp. rate 18, height 5\' 6"  (1.676 m), weight 149 lb 12.8 oz (67.949 kg). Body mass index is 24.18 kg/(m^2).  Gen:  WDWN WF NAD  Neurological: Alert and oriented to person, place, and time. Motor and sensory function is grossly intact  Head: Normocephalic and atraumatic.  Eyes: Conjunctivae are normal. Pupils are equal, round, and reactive to light. No scleral icterus.  Neck: Right thyroid goiter  Cardiovascular:  SR without murmurs or gallops.  No carotid bruits Respiratory: Effort normal.  No respiratory distress. No chest wall tenderness. Breath sounds  normal.  No wheezes, rales or rhonchi.  Abdomen:  nontender GU: Musculoskeletal:Extremities are nontender. No cyanosis, edema or clubbing noted Lymphadenopathy: No cervical, preauricular, postauricular or axillary adenopathy is present Skin: Skin is warm and dry. No rash noted. No diaphoresis. No erythema. No pallor. Pscyh: Normal mood and affect. Behavior is normal. Judgment and thought content normal.   LABORATORY RESULTS: No results found for this or any previous visit (from the past 48 hour(s)).  RADIOLOGY RESULTS: No results found.  Problem  List: Patient Active Problem List  Diagnosis  (none) - all problems resolved or deleted    Assessment & Plan: Thyroid goiter-right  Plan subtotal thyroidectomy To be followed by anterior cervical approach for C spine repair    Matt B. Daphine Deutscher, MD, [facility] [phone] beeper [phone]  05/09/2012 12:21 PM

## 2012-05-09 NOTE — Patient Instructions (Signed)
Goiter  Goiter is an enlarged thyroid gland. The thyroid gland sits at the base of the front of the neck. The gland produces hormones that regulate mood, body temperature, pulse rate, and digestion. Most goiters are painless and are not a cause for serious concern. Goiters and conditions that cause goiters can be treated if necessary.   CAUSES   Common causes of goiter include:   Graves disease (causes too much hormone to be produced [hyperthyroidism]).   Hashimoto's disease (causes too little hormone to be produced [hypothyroidism]).   Thyroiditis (inflammation of the thyroid sometimes caused by virus or pregnancy).   Nodular goiter (small bumps form; sometimes called toxic nodular goiter).   Pregnancy.   Thyroid cancer (very few goiters with nodules are cancerous).   Certain medications.   Radiation exposure.   Iodine deficiency (more common in developing countries in inland populations).  RISK FACTORS  Risk factors for goiter include:   A family history of goiter.   Female gender.   Inadequate iodine in the diet.   Age older than 40 years.  SYMPTOMS   Many goiters do not cause symptoms. When symptoms do occur, they may include:   Swelling in the lower part of the neck. This swelling can range from a very small bump to a large lump.   A tight feeling in the throat.   A hoarse voice.  Less commonly, a goiter may result in:   Coughing.   Wheezing.   Difficulty swallowing.   Difficulty breathing.   Bulging neck veins.   Dizziness.  When a goiter is the result of hyperthyroidism, symptoms may include:   Rapid or irregular heart beat.   Sicknessin your stomach (nausea).   Vomiting.   Diarrhea.   Shaking.   Irritable feeling.   Bulging eyes.   Weight loss.   Heat sensitivity.   Anxiety.  When a goiter is the result of hypothyroidism, symptoms may include:   Tiredness.   Dry skin.   Constipation.   Weight gain.   Irregular menstrual cycle.   Depressed mood.   Sensitivity to cold.   DIAGNOSIS   Tests used to diagnose goiter include:   A physical exam.   Blood tests, including thyroid hormone levels and antibody testing.   Ultrasonography, computerized X-ray scan (computed tomography, CT) or computerized magnetic scan (magnetic resonance imaging, MRI).   Thyroid scan (imaging along with safe radioactive injection).   Tissue sample taken (biopsy) of nodules. This is sometimes done to confirm that the nodules are not cancerous.  TREATMENT   Treatment will depend on the cause of the goiter. Treatment may include:   Monitoring. In some cases, no treatment is necessary, and your doctor will monitor yourcondition at regular check ups.   Medications and supplements. Thyroid medication (thyroid hormone replacement) is available for hyperthroidism and hypothyroidism.   If inflammation is the cause, over-the-counter medication or steroid medication may be recommended.   Goiters caused by iodine deficiency can be treated with iodine supplements or changes in diet.   Radioactive iodine treatment. Radioactive iodine is injected into the blood. It travels to the thyroid gland, kills thyroid cells, and reduces the size of the gland. This is only used when the thyroid gland is overactive. Lifelong thyroid hormone medication is often necessary after this treatment.   Surgery. A procedure to remove all or part of the gland may be recommended in severe cases or when cancer is the cause. Hormones can be taken to replace the hormones normally   produced by the thyroid.  HOME CARE INSTRUCTIONS    Take medications as directed.   Follow your caregiver's recommendations for any dietary changes.   Follow up with your caregiver for further examination and testing, as directed.  PREVENTION    If you have a family history of goiter, discuss screening with your doctor.   Make sure you are getting enough iodine in your diet.   Use of iodized table salt can help prevent iodine deficiency.   Document Released: 12/28/2009 Document Revised: 10/02/2011 Document Reviewed: 12/28/2009  Exit[employer] Patient Information 2013 [employer].

## 2012-06-17 ENCOUNTER — Encounter (HOSPITAL_COMMUNITY): Payer: Self-pay | Admitting: Pharmacy Technician

## 2012-06-24 ENCOUNTER — Ambulatory Visit (HOSPITAL_COMMUNITY)
Admission: RE | Admit: 2012-06-24 | Discharge: 2012-06-24 | Disposition: A | Discharge status: Home / Self Care | Payer: Medicare Other | Source: Ambulatory Visit | Attending: Surgery | Admitting: Surgery

## 2012-06-24 ENCOUNTER — Encounter (HOSPITAL_COMMUNITY): Payer: Self-pay

## 2012-06-24 ENCOUNTER — Telehealth (INDEPENDENT_AMBULATORY_CARE_PROVIDER_SITE_OTHER): Payer: Self-pay | Admitting: General Surgery

## 2012-06-24 ENCOUNTER — Encounter (HOSPITAL_COMMUNITY)
Admission: RE | Admit: 2012-06-24 | Discharge: 2012-06-24 | Disposition: A | Discharge status: Home / Self Care | Payer: Medicare Other | Source: Ambulatory Visit | Attending: Surgery | Admitting: Surgery

## 2012-06-24 ENCOUNTER — Telehealth (INDEPENDENT_AMBULATORY_CARE_PROVIDER_SITE_OTHER): Payer: Self-pay

## 2012-06-24 DIAGNOSIS — I10 Essential (primary) hypertension: Secondary | ICD-10-CM | POA: Insufficient documentation

## 2012-06-24 DIAGNOSIS — Z01818 Encounter for other preprocedural examination: Secondary | ICD-10-CM | POA: Insufficient documentation

## 2012-06-24 DIAGNOSIS — I7090 Unspecified atherosclerosis: Secondary | ICD-10-CM | POA: Insufficient documentation

## 2012-06-24 HISTORY — DX: Malignant (primary) neoplasm, unspecified: C80.1

## 2012-06-24 HISTORY — DX: Cardiac arrhythmia, unspecified: I49.9

## 2012-06-24 HISTORY — DX: Unspecified glaucoma: H40.9

## 2012-06-24 LAB — BASIC METABOLIC PANEL
BUN: 15 mg/dL (ref 6–23)
Chloride: 107 mEq/L (ref 96–112)
GFR calc Af Amer: 83 mL/min — ABNORMAL LOW (ref >90)
GFR calc non Af Amer: 71 mL/min — ABNORMAL LOW (ref >90)
Potassium: 4.8 mEq/L (ref 3.5–5.1)
Sodium: 142 mEq/L (ref 135–145)

## 2012-06-24 LAB — CBC
HCT: 39.8 % (ref 36.0–46.0)
Hemoglobin: 13.2 g/dL (ref 12.0–15.0)
RDW: 13.4 % (ref 11.5–15.5)
WBC: 4.9 10*3/uL (ref 4.0–10.5)

## 2012-06-24 NOTE — Progress Notes (Signed)
06-24-12 1530 Dr. Daphine Deutscher, note CXR. Also do you desire pt. To use Fleet enema night before as ordered?. What about Heparin injection preop also?Marland KitchenW.Keisy Strickler,RN

## 2012-06-24 NOTE — Telephone Encounter (Signed)
error 

## 2012-06-24 NOTE — Telephone Encounter (Signed)
Please review pre op orders you have in for this patient. They are questioning if patient needs fleets enema since she is having parathyroidectomy 12/6.

## 2012-06-24 NOTE — Patient Instructions (Addendum)
20 EDDIS PINGLETON  06/24/2012   Your procedure is scheduled on:  12-6 -2013  Report to [facility] at       0530 AM.  Call this number if you have problems the morning of surgery: [phone]  Or Presurgical Testing [phone](Rakeya Glab)   Remember:Check with MD office on Fleet Enema use 1 adult to use night before( RN will check and inform you)   Do not eat food:After Midnight.   Take these medicines the morning of surgery with A SIP OF WATER: Bring Timolol eye drops and use as usual.   Do not wear jewelry, make-up or nail polish.  Do not wear lotions, powders, or perfumes. You may wear deodorant.  Do not shave 48 hours prior to surgery.(face and neck okay, no shaving of legs)  Do not bring valuables to the hospital.  Contacts, dentures or bridgework may not be worn into surgery.  Leave suitcase in the car. After surgery it may be brought to your room.  For patients admitted to the hospital, checkout time is 11:00 AM the day of discharge.   Patients discharged the day of surgery will not be allowed to drive home. Must have responsible person with you x 24 hours once discharged.  Name and phone number of your driver: Elmarie Shiley, friend  Special Instructions: CHG Shower Use Special Wash: see special instruction sheet.(avoid face and genitals)   Please read over the following fact sheets that you were given: MRSA Information.

## 2012-06-24 NOTE — Pre-Procedure Instructions (Addendum)
06-24-12 EKG 6'13 -Epic. CXR done today. 06-25-12 1250 -Calls x2 attempts to see if Pt. Needed to do Fleet enema preop- instructed pt. Not to do- unless call received to definitely do. W. Maisey Deandrade,RN 06-25-12 1330 call from [facility] of Dr. Ermalene Searing office-pt. Do not need Fleet enema or Heparin injection preop-follow all other orders as given. W. Kennon Portela

## 2012-06-25 ENCOUNTER — Telehealth (INDEPENDENT_AMBULATORY_CARE_PROVIDER_SITE_OTHER): Payer: Self-pay | Admitting: General Surgery

## 2012-06-25 NOTE — Telephone Encounter (Signed)
Wilhemina called from preop testing at the hospital. Patient is scheduled for thyroid surgery and has orders for a fleets enema and heparin. Please cancel orders if not needed.

## 2012-06-27 NOTE — H&P (Signed)
Chief Complaint: Large right sided goiter  History of Present Illness: ARITZEL KRUSEMARK is an 66 y.o. female who comes in from [facility] office having seen Junita Push regarding severe cervical degenerative changes. She has been having coldness in her left arm for a few years and this was found that worked up by Dr. Clarene Duke who obtained an MRI of her neck. This demonstrated some significant cervical degenerative disease as well as a large right-sided thyroid goiter that has been followed over a number years by Dr. Anson Fret. She states that she has had at least 2 needle aspirate biopsies which have not shown cancer and this was treated as a goiter. On her MRI she does have some tracheal deviation and on physical exam its predominantly right-sided with very little on the left.  I discussed removal of the right side with maybe some of the left side and then placing her on Synthroid postoperatively. We are doing this to get her ready for have an anterior cervical approach by Dr. Roanna Epley. His concern is that with a goiter as it is now he would be unable to do an anterior approach which would be better for her. I've given her thyroid booklet and her friend was there and I discussed the operation and the risk including recurrent laryngeal nerve palsy as well as hypocalcemia from parathyroid removal. They're aware of the risk and she wants to proceed with thyroidectomy.  Past Medical History   Diagnosis  Date   .  Arthritis    .  Hypertension    .  Thyroid disease      goiter-no meds   .  GERD (gastroesophageal reflux disease)    .  Thyroid nodule     Past Surgical History   Procedure  Date   .  Knee surgery  1967     right   .  Abdominal hysterectomy  03/2010   .  Colonoscopy    .  Breast ductal system excision  01/17/2012     Procedure: EXCISION DUCTAL SYSTEM BREAST; Surgeon: Emelia Loron, MD; Location: [facility]; Service: General; Laterality: Right; Right breast duct  excision    Current Outpatient Prescriptions   Medication  Sig  Dispense  Refill   .  Multiple Vitamin (MULTIVITAMIN) capsule  Take 1 capsule by mouth daily.     .  ramipril (ALTACE) 10 MG capsule  Take 10 mg by mouth daily.     .  timolol (TIMOPTIC) 0.5 % ophthalmic solution  Place 1 drop into both eyes daily.      Review of patient's allergies indicates no known allergies.  Family History   Problem  Relation  Age of Onset   .  Stroke  Mother    .  Hypertension  Father     Social History: reports that she quit smoking about 20 years ago. She has never used smokeless tobacco. She reports that she does not drink alcohol or use illicit drugs.  REVIEW OF SYSTEMS - PERTINENT POSITIVES ONLY:  No DVT history  Physical Exam:  Blood pressure 144/90, pulse 64, temperature 97.6 F (36.4 C), temperature source Temporal, resp. rate 18, height 5\' 6"  (1.676 m), weight 149 lb 12.8 oz (67.949 kg).  Body mass index is 24.18 kg/(m^2).  Gen: WDWN WF NAD  Neurological: Alert and oriented to person, place, and time. Motor and sensory function is grossly intact  Head: Normocephalic and atraumatic.  Eyes: Conjunctivae are normal. Pupils are equal, round, and reactive  to light. No scleral icterus.  Neck: Right thyroid goiter  Cardiovascular: SR without murmurs or gallops. No carotid bruits  Respiratory: Effort normal. No respiratory distress. No chest wall tenderness. Breath sounds normal. No wheezes, rales or rhonchi.  Abdomen: nontender  GU:  Musculoskeletal:Extremities are nontender. No cyanosis, edema or clubbing noted Lymphadenopathy: No cervical, preauricular, postauricular or axillary adenopathy is present Skin: Skin is warm and dry. No rash noted. No diaphoresis. No erythema. No pallor. Pscyh: Normal mood and affect. Behavior is normal. Judgment and thought content normal.  LABORATORY RESULTS:  No results found for this or any previous visit (from the past 48 hour(s)).  RADIOLOGY RESULTS:  No  results found.  Problem List:  Patient Active Problem List   Diagnosis   (none) - all problems resolved or deleted    Assessment & Plan:  Thyroid goiter-right Plan subtotal thyroidectomy  To be followed by anterior cervical approach for C spine repair  Matt B. Daphine Deutscher, MD, [facility]  [phone] beeper  [phone]

## 2012-06-28 ENCOUNTER — Encounter (HOSPITAL_COMMUNITY): Payer: Self-pay

## 2012-06-28 ENCOUNTER — Observation Stay (HOSPITAL_COMMUNITY)
Admission: RE | Admit: 2012-06-28 | Discharge: 2012-06-29 | Disposition: A | Discharge status: Home / Self Care | Payer: Medicare Other | Source: Ambulatory Visit | Attending: Surgery | Admitting: Surgery

## 2012-06-28 ENCOUNTER — Encounter (HOSPITAL_COMMUNITY): Payer: Self-pay | Admitting: Anesthesiology

## 2012-06-28 ENCOUNTER — Encounter (HOSPITAL_COMMUNITY)
Admission: RE | Disposition: A | Discharge status: Home / Self Care | Payer: Self-pay | Source: Ambulatory Visit | Attending: Surgery

## 2012-06-28 ENCOUNTER — Ambulatory Visit (HOSPITAL_COMMUNITY): Payer: Medicare Other | Admitting: Anesthesiology

## 2012-06-28 DIAGNOSIS — E042 Nontoxic multinodular goiter: Principal | ICD-10-CM | POA: Insufficient documentation

## 2012-06-28 DIAGNOSIS — Z79899 Other long term (current) drug therapy: Secondary | ICD-10-CM | POA: Insufficient documentation

## 2012-06-28 DIAGNOSIS — I10 Essential (primary) hypertension: Secondary | ICD-10-CM | POA: Insufficient documentation

## 2012-06-28 DIAGNOSIS — K219 Gastro-esophageal reflux disease without esophagitis: Secondary | ICD-10-CM | POA: Insufficient documentation

## 2012-06-28 HISTORY — DX: Myoneural disorder, unspecified: G70.9

## 2012-06-28 HISTORY — DX: Adverse effect of unspecified anesthetic, initial encounter: T41.45XA

## 2012-06-28 HISTORY — DX: Other complications of anesthesia, initial encounter: T88.59XA

## 2012-06-28 HISTORY — PX: THYROIDECTOMY, PARTIAL: SHX18

## 2012-06-28 SURGERY — THYROIDECTOMY, SUBTOTAL
Anesthesia: General | Site: Neck | Laterality: Right | Wound class: Clean

## 2012-06-28 MED ORDER — HYDROCODONE-ACETAMINOPHEN 5-325 MG PO TABS: 1.0000 | ORAL_TABLET | ORAL | Status: DC | PRN

## 2012-06-28 MED ORDER — MORPHINE SULFATE 2 MG/ML IJ SOLN: 1.0000 mg | INTRAMUSCULAR | Status: DC | PRN

## 2012-06-28 MED ORDER — ACETAMINOPHEN 10 MG/ML IV SOLN
INTRAVENOUS | Status: DC | PRN
  Administered 2012-06-28: 1000 mg via INTRAVENOUS

## 2012-06-28 MED ORDER — HYDROMORPHONE HCL PF 1 MG/ML IJ SOLN
INTRAMUSCULAR | Status: AC
  Filled 2012-06-28: qty 1

## 2012-06-28 MED ORDER — CHLORHEXIDINE GLUCONATE 4 % EX LIQD
1.0000 "application " | Freq: Once | CUTANEOUS | Status: DC
  Filled 2012-06-28: qty 15

## 2012-06-28 MED ORDER — RAMIPRIL 10 MG PO CAPS
10.0000 mg | ORAL_CAPSULE | Freq: Every day | ORAL | Status: DC
  Administered 2012-06-28: 10 mg via ORAL
  Filled 2012-06-28 (×2): qty 1

## 2012-06-28 MED ORDER — MIDAZOLAM HCL 5 MG/5ML IJ SOLN
INTRAMUSCULAR | Status: DC | PRN
  Administered 2012-06-28: 2 mg via INTRAVENOUS

## 2012-06-28 MED ORDER — CEFAZOLIN SODIUM-DEXTROSE 2-3 GM-% IV SOLR
INTRAVENOUS | Status: AC
  Filled 2012-06-28: qty 50

## 2012-06-28 MED ORDER — EPHEDRINE SULFATE 50 MG/ML IJ SOLN
INTRAMUSCULAR | Status: DC | PRN
  Administered 2012-06-28: 10 mg via INTRAVENOUS

## 2012-06-28 MED ORDER — ACETAMINOPHEN 10 MG/ML IV SOLN
INTRAVENOUS | Status: AC
  Filled 2012-06-28: qty 100

## 2012-06-28 MED ORDER — PROPOFOL 10 MG/ML IV BOLUS
INTRAVENOUS | Status: DC | PRN
  Administered 2012-06-28: 140 mg via INTRAVENOUS

## 2012-06-28 MED ORDER — TIMOLOL MALEATE 0.5 % OP SOLN
1.0000 [drp] | Freq: Every day | OPHTHALMIC | Status: DC
  Filled 2012-06-28: qty 5

## 2012-06-28 MED ORDER — LIDOCAINE HCL (CARDIAC) 20 MG/ML IV SOLN
INTRAVENOUS | Status: DC | PRN
  Administered 2012-06-28: 50 mg via INTRAVENOUS

## 2012-06-28 MED ORDER — LACTATED RINGERS IV SOLN
INTRAVENOUS | Status: DC
  Administered 2012-06-28: 11:00:00 via INTRAVENOUS

## 2012-06-28 MED ORDER — GLYCOPYRROLATE 0.2 MG/ML IJ SOLN
INTRAMUSCULAR | Status: DC | PRN
  Administered 2012-06-28: 0.4 mg via INTRAVENOUS

## 2012-06-28 MED ORDER — FENTANYL CITRATE 0.05 MG/ML IJ SOLN
INTRAMUSCULAR | Status: DC | PRN
  Administered 2012-06-28 (×4): 50 ug via INTRAVENOUS

## 2012-06-28 MED ORDER — CEFAZOLIN SODIUM-DEXTROSE 2-3 GM-% IV SOLR
2.0000 g | INTRAVENOUS | Status: AC
  Administered 2012-06-28: 2 g via INTRAVENOUS

## 2012-06-28 MED ORDER — NEOSTIGMINE METHYLSULFATE 1 MG/ML IJ SOLN
INTRAMUSCULAR | Status: DC | PRN
  Administered 2012-06-28: 3 mg via INTRAVENOUS

## 2012-06-28 MED ORDER — ONDANSETRON HCL 4 MG/2ML IJ SOLN
4.0000 mg | Freq: Four times a day (QID) | INTRAMUSCULAR | Status: DC | PRN
  Administered 2012-06-28: 4 mg via INTRAVENOUS
  Filled 2012-06-28: qty 2

## 2012-06-28 MED ORDER — 0.9 % SODIUM CHLORIDE (POUR BTL) OPTIME
TOPICAL | Status: DC | PRN
  Administered 2012-06-28: 1000 mL

## 2012-06-28 MED ORDER — LACTATED RINGERS IV SOLN
INTRAVENOUS | Status: DC | PRN
  Administered 2012-06-28 (×2): via INTRAVENOUS

## 2012-06-28 MED ORDER — KCL IN DEXTROSE-NACL 20-5-0.45 MEQ/L-%-% IV SOLN
INTRAVENOUS | Status: DC
  Administered 2012-06-28: 20:00:00 via INTRAVENOUS
  Filled 2012-06-28 (×2): qty 1000

## 2012-06-28 MED ORDER — LEVOTHYROXINE SODIUM 100 MCG PO TABS
100.0000 ug | ORAL_TABLET | Freq: Every day | ORAL | Status: DC
  Administered 2012-06-29: 100 ug via ORAL
  Filled 2012-06-28 (×2): qty 1

## 2012-06-28 MED ORDER — ONDANSETRON HCL 4 MG PO TABS: 4.0000 mg | ORAL_TABLET | Freq: Four times a day (QID) | ORAL | Status: DC | PRN

## 2012-06-28 MED ORDER — ONDANSETRON HCL 4 MG/2ML IJ SOLN
INTRAMUSCULAR | Status: DC | PRN
  Administered 2012-06-28: 4 mg via INTRAVENOUS

## 2012-06-28 MED ORDER — ROCURONIUM BROMIDE 100 MG/10ML IV SOLN
INTRAVENOUS | Status: DC | PRN
  Administered 2012-06-28: 40 mg via INTRAVENOUS

## 2012-06-28 MED ORDER — HYDROMORPHONE HCL PF 1 MG/ML IJ SOLN
0.2500 mg | INTRAMUSCULAR | Status: DC | PRN
  Administered 2012-06-28 (×2): 0.5 mg via INTRAVENOUS

## 2012-06-28 SURGICAL SUPPLY — 46 items
APL SKNCLS STERI-STRIP NONHPOA (GAUZE/BANDAGES/DRESSINGS) ×1
ATTRACTOMAT 16X20 MAGNETIC DRP (DRAPES) ×2 IMPLANT
BENZOIN TINCTURE PRP APPL 2/3 (GAUZE/BANDAGES/DRESSINGS) ×2 IMPLANT
BLADE HEX COATED 2.75 (ELECTRODE) ×2 IMPLANT
BLADE SURG 15 STRL LF DISP TIS (BLADE) ×1 IMPLANT
BLADE SURG 15 STRL SS (BLADE) ×2
BLADE SURG SZ10 CARB STEEL (BLADE) ×1 IMPLANT
CANISTER SUCTION 2500CC (MISCELLANEOUS) ×2 IMPLANT
CLIP TI WIDE RED SMALL 6 (CLIP) ×6 IMPLANT
CLOTH BEACON ORANGE TIMEOUT ST (SAFETY) ×2 IMPLANT
CLSR STERI-STRIP ANTIMIC 1/2X4 (GAUZE/BANDAGES/DRESSINGS) ×1 IMPLANT
DISSECTOR ROUND CHERRY 3/8 STR (MISCELLANEOUS) ×3 IMPLANT
DRAIN PENROSE 18X1/4 LTX STRL (WOUND CARE) ×1 IMPLANT
DRAPE PED LAPAROTOMY (DRAPES) ×2 IMPLANT
ELECT NDL TIP 2.8 STRL (NEEDLE) IMPLANT
ELECT NEEDLE TIP 2.8 STRL (NEEDLE) IMPLANT
ELECT REM PT RETURN 9FT ADLT (ELECTROSURGICAL) ×2
ELECTRODE REM PT RTRN 9FT ADLT (ELECTROSURGICAL) ×1 IMPLANT
GAUZE SPONGE 4X4 16PLY XRAY LF (GAUZE/BANDAGES/DRESSINGS) ×4 IMPLANT
GLOVE BIOGEL M 8.0 STRL (GLOVE) ×2 IMPLANT
GLOVE BIOGEL PI IND STRL 7.5 (GLOVE) IMPLANT
GLOVE BIOGEL PI INDICATOR 7.5 (GLOVE) ×2
GLOVE SURG SIGNA 7.5 PF LTX (GLOVE) ×1 IMPLANT
GLOVE SURG SS PI 7.0 STRL IVOR (GLOVE) ×2 IMPLANT
GLOVE SURG SS PI 7.5 STRL IVOR (GLOVE) ×1 IMPLANT
GOWN STRL NON-REIN LRG LVL3 (GOWN DISPOSABLE) ×1 IMPLANT
GOWN STRL REIN XL XLG (GOWN DISPOSABLE) ×6 IMPLANT
HEMOSTAT SURGICEL 2X14 (HEMOSTASIS) ×2 IMPLANT
KIT BASIN OR (CUSTOM PROCEDURE TRAY) ×2 IMPLANT
NS IRRIG 1000ML POUR BTL (IV SOLUTION) ×2 IMPLANT
PACK BASIC VI WITH GOWN DISP (CUSTOM PROCEDURE TRAY) ×2 IMPLANT
PENCIL BUTTON HOLSTER BLD 10FT (ELECTRODE) ×2 IMPLANT
SHEARS HARMONIC 9CM CVD (BLADE) ×1 IMPLANT
SPONGE GAUZE 4X4 12PLY (GAUZE/BANDAGES/DRESSINGS) ×1 IMPLANT
STAPLER VISISTAT 35W (STAPLE) ×2 IMPLANT
SUT SILK 2 0 (SUTURE) ×2
SUT SILK 2 0 SH (SUTURE) ×1 IMPLANT
SUT SILK 2-0 18XBRD TIE 12 (SUTURE) ×1 IMPLANT
SUT SILK 3 0 (SUTURE)
SUT SILK 3-0 18XBRD TIE 12 (SUTURE) IMPLANT
SUT VIC AB 3-0 SH 8-18 (SUTURE) ×1 IMPLANT
SUT VIC AB 4-0 SH 18 (SUTURE) ×3 IMPLANT
SUT VIC AB 5-0 P-3 18XBRD (SUTURE) IMPLANT
SUT VIC AB 5-0 P3 18 (SUTURE)
SYR BULB IRRIGATION 50ML (SYRINGE) ×2 IMPLANT
YANKAUER SUCT BULB TIP 10FT TU (MISCELLANEOUS) ×2 IMPLANT

## 2012-06-28 NOTE — Transfer of Care (Signed)
Immediate Anesthesia Transfer of Care Note  Patient: Susan Ali  Procedure(s) Performed: Procedure(s) (LRB) with comments: THYROIDECTOMY SUBTOTAL (Right)  Patient Location: PACU  Anesthesia Type:General  Level of Consciousness: awake, alert , oriented and patient cooperative  Airway & Oxygen Therapy: Patient Spontanous Breathing and Patient connected to face mask oxygen  Post-op Assessment: Report given to PACU RN, Post -op Vital signs reviewed and stable and Patient moving all extremities X 4  Post vital signs: Reviewed and stable  Complications: No apparent anesthesia complications

## 2012-06-28 NOTE — Anesthesia Postprocedure Evaluation (Signed)
  Anesthesia Post-op Note  Patient: Susan Ali  Procedure(s) Performed: Procedure(s) (LRB): THYROIDECTOMY SUBTOTAL (Right)  Patient Location: PACU  Anesthesia Type: General  Level of Consciousness: awake and alert   Airway and Oxygen Therapy: Patient Spontanous Breathing  Post-op Pain: mild  Post-op Assessment: Post-op Vital signs reviewed, Patient's Cardiovascular Status Stable, Respiratory Function Stable, Patent Airway and No signs of Nausea or vomiting  Last Vitals:  Filed Vitals:   06/28/12 1041  BP: 200/86  Pulse:   Temp:   Resp:     Post-op Vital Signs: stable   Complications: No apparent anesthesia complications

## 2012-06-28 NOTE — Anesthesia Preprocedure Evaluation (Addendum)
Anesthesia Evaluation  Patient identified by MRN, date of birth, ID band Patient awake    Reviewed: Allergy & Precautions, H&P , NPO status , Patient's Chart, lab work & pertinent test results  Airway Mallampati: II TM Distance: >3 FB Neck ROM: full   Comment: Goiter without tracheal deviation Dental No notable dental hx. (+) Teeth Intact and Dental Advisory Given   Pulmonary neg pulmonary ROS,  breath sounds clear to auscultation  Pulmonary exam normal       Cardiovascular Exercise Tolerance: Good hypertension, On Medications Rhythm:regular Rate:Normal  HR dropped after surgery in PACU and had to be put in ICU.   Neuro/Psych Cervical impingement negative neurological ROS  negative psych ROS   GI/Hepatic negative GI ROS, Neg liver ROS, GERD-  Controlled,  Endo/Other  negative endocrine ROS  Renal/GU negative Renal ROS  negative genitourinary   Musculoskeletal   Abdominal   Peds  Hematology negative hematology ROS (+)   Anesthesia Other Findings   Reproductive/Obstetrics negative OB ROS                         Anesthesia Physical Anesthesia Plan  ASA: II  Anesthesia Plan: General   Post-op Pain Management:    Induction: Intravenous  Airway Management Planned: Oral ETT  Additional Equipment:   Intra-op Plan:   Post-operative Plan: Extubation in OR  Informed Consent: I have reviewed the patients History and Physical, chart, labs and discussed the procedure including the risks, benefits and alternatives for the proposed anesthesia with the patient or authorized representative who has indicated his/her understanding and acceptance.   Dental Advisory Given  Plan Discussed with: CRNA and Surgeon  Anesthesia Plan Comments:         Anesthesia Quick Evaluation

## 2012-06-28 NOTE — Care Management Note (Signed)
    Page 1 of 1   06/28/2012     1:10:38 PM   CARE MANAGEMENT NOTE 06/28/2012  Patient:  Susan Ali, Susan Ali   Account Number:  [id number]  Date Initiated:  06/28/2012  Documentation initiated by:  Lorenda Ishihara  Subjective/Objective Assessment:   66 yo female admitted s/p thyroidectomy. PTA lived at home with friend     Action/Plan:   Home when stable   Anticipated DC Date:  06/28/2012   Anticipated DC Plan:  HOME/SELF CARE         Choice offered to / List presented to:             Status of service:  Completed, signed off Medicare Important Message given?   (If response is "NO", the following Medicare IM given date fields will be blank) Date Medicare IM given:   Date Additional Medicare IM given:    Discharge Disposition:  HOME/SELF CARE  Per UR Regulation:  Reviewed for med. necessity/level of care/duration of stay  If discussed at Long Length of Stay Meetings, dates discussed:    Comments:

## 2012-06-28 NOTE — Preoperative (Signed)
Beta Blockers   Reason not to administer Beta Blockers:Not Applicable 

## 2012-06-28 NOTE — Interval H&P Note (Signed)
History and Physical Interval Note:  06/28/2012 7:40 AM  Susan Ali  has presented today for surgery, with the diagnosis of Large goiter predominantly right  The various methods of treatment have been discussed with the patient and family. After consideration of risks, benefits and other options for treatment, the patient has consented to  Procedure(s) (LRB) with comments: THYROIDECTOMY SUBTOTAL (Right) - Subtotal thyroidectomy as a surgical intervention .  The patient's history has been reviewed, patient examined, no change in status, stable for surgery.  I have reviewed the patient's chart and labs.  Questions were answered to the patient's satisfaction.     Zell Hylton B

## 2012-06-28 NOTE — Op Note (Signed)
Surgeon: Wenda Low, MD, FACS  Asst:  Ovidio Kin, MD, FACS  Anes:  general  Procedure: Right goiter and subtotal thyroidectomy  Diagnosis: Retrosternal goiter  Complications: none  EBL:   300 cc  Description of Procedure:  The patient was taken to oh or 6 and given general anesthesia. She was placed with her head slightly extended and the neck was prepped with ChloraPrep with chlorhexidine and draped sterilely. 2 cm above the clavicles in the sternal notch a curvilinear incision was made in the skin crease from sternocleidomastoid to sternocleidomastoid. After dividing the platysma muscle superior and inferior flaps were created and the Mahorner retractor inserted. The midline was identified and incised.initially I got beneath the right sternocleidomastoid and began the dissection. It became obvious this was a huge retrosternal border and then anterior probably isthmus lobe taken across the midline toward the left side. There was not much the way of the left lobe that was palpable. I divided the sternocleidomastoid muscles and again a fairly tedious dissection to free up this a low gland on the right. I was operating down beneath the right clavicle in the chest mobilizing this gland and eventually was able to bring it up into the wound. Superiorly I did go up in identified the superior polar vessels clipped these and used a harmonic to divide them staying on the gland. Once this was a stayed on the gland and resected this very large border. A separated from the other nodular mass More in the midline was more of the lobe. After rate resecting the retrosternal portion I then resected the anterior portion and a method to preserve the blood supply and the neurovascular bundles coming in through the tracheoesophageal groove out so to preserve the parathyroid glands on that side. This was completed and the remnant limb was oversewn with figure-of-eight sutures of 4-0 Vicryl. Bleeding was controlled.  Bleeding had been somewhat brisk in the mobilization process as it was very vascular goiter. Surgicel was placed in the right side a quarter inch Penrose drain was inserted. The strap muscles were reapproximated both on the right side and in the midline. Wound was closed in layers with  4-0 Vicryl sutures subcutaneously and subcuticularly and with benzoin Steri-Strips on the skin. Patient are the procedure well taken recovery in satisfactory condition.  Matt B. Daphine Deutscher, MD, [facility], [place] [phone]

## 2012-06-29 LAB — BASIC METABOLIC PANEL
BUN: 8 mg/dL (ref 6–23)
CO2: 26 mEq/L (ref 19–32)
Chloride: 101 mEq/L (ref 96–112)
Creatinine, Ser: 0.68 mg/dL (ref 0.50–1.10)

## 2012-06-29 LAB — CBC
HCT: 33.7 % — ABNORMAL LOW (ref 36.0–46.0)
MCV: 87.8 fL (ref 78.0–100.0)
Platelets: 231 10*3/uL (ref 150–400)
RBC: 3.84 MIL/uL — ABNORMAL LOW (ref 3.87–5.11)
WBC: 7.4 10*3/uL (ref 4.0–10.5)

## 2012-06-29 MED ORDER — LEVOTHYROXINE SODIUM 100 MCG PO TABS: 100.0000 ug | ORAL_TABLET | Freq: Every day | ORAL | Status: DC

## 2012-06-29 MED ORDER — HYDROCODONE-ACETAMINOPHEN 5-325 MG PO TABS: 1.0000 | ORAL_TABLET | ORAL | Status: DC | PRN

## 2012-06-29 NOTE — Discharge Summary (Signed)
Physician Discharge Summary  Patient ID: Susan Ali MRN: [id number] DOB/AGE: 1946-04-05 66 y.o.  Admit date: 06/28/2012 Discharge date: 06/29/2012  Admission Diagnoses:  Discharge Diagnoses:  Active Problems:  Nontoxic multinodular goiter   Discharged Condition: good  Hospital Course: Postoperatively, patient mobilized and advanced on her diet gradually.  Pain was well-controlled and transitioned off IV medications.  Mild nausea at first but resolved by d/c.  By the time of discharge, the patient was walking well the hallways, eating food well, having flatus.  Pain was-controlled on an oral regimen.  Based on meeting DC criteria and recovering well, I felt it was safe for the patient to be discharged home with close followup.  Instructions were discussed in detail.  They are written as well.     Consults: None  Significant Diagnostic Studies:   Treatments: surgery: Subtotal thyroidectomy for retrosternal goiter  Discharge Exam: Blood pressure 123/71, pulse 89, temperature 98.5 F (36.9 C), temperature source Oral, resp. rate 18, height 5\' 6"  (1.676 m), weight 149 lb 7.6 oz (67.8 kg), SpO2 95.00%.  General: Pt awake/alert/oriented x4 in no major acute distress Eyes: PERRL, normal EOM. Sclera nonicteric Neuro: CN II-XII intact w/o focal sensory/motor deficits.  No hoarseness.  Talking easily Lymph: No head/neck/groin lymphadenopathy Psych:  No delerium/psychosis/paranoia HENT: Normocephalic, Mucus membranes moist.  No thrush.   Neck: Supple, No tracheal deviation.  Incision intact.  No hematoma/swelling.  Scant serosanguinous drainage - penrose removed. Chest: No pain.  Good respiratory excursion. CV:  Pulses intact.  Regular rhythm MS: Normal AROM mjr joints.  No obvious deformity Abdomen: Soft, Nondistended.  Nontender.  No incarcerated hernias. Ext:  SCDs BLE.  No significant edema.  No cyanosis Skin: No petechiae / purpurae   Disposition: 01-Home or Self  Care  Discharge Orders    Future Appointments: Provider: Department: Dept Phone: Center:   07/11/2012 11:10 AM Valarie Merino, MD [facility], [place] [phone] None       Medication List     As of 06/29/2012  7:56 AM    TAKE these medications         HYDROcodone-acetaminophen 5-325 MG per tablet   Commonly known as: NORCO/VICODIN   Take 1-2 tablets by mouth every 4 (four) hours as needed.      levothyroxine 100 MCG tablet   Commonly known as: SYNTHROID, LEVOTHROID   Take 1 tablet (100 mcg total) by mouth daily before breakfast.      multivitamin capsule   Take 1 capsule by mouth daily.      ramipril 10 MG capsule   Commonly known as: ALTACE   Take 10 mg by mouth daily.      timolol 0.5 % ophthalmic solution   Commonly known as: TIMOPTIC   Place 1 drop into both eyes daily.           Follow-up Information    Follow up with Luretha Murphy B, MD. Schedule an appointment as soon as possible for a visit in 2 weeks.   Contact information:   [address] [place] [address] [phone]          Signed: Ardeth Sportsman. 06/29/2012, 7:56 AM

## 2012-07-01 ENCOUNTER — Encounter (HOSPITAL_COMMUNITY): Payer: Self-pay | Admitting: Surgery

## 2012-07-11 ENCOUNTER — Encounter (INDEPENDENT_AMBULATORY_CARE_PROVIDER_SITE_OTHER): Payer: Self-pay | Admitting: Surgery

## 2012-07-11 ENCOUNTER — Ambulatory Visit (INDEPENDENT_AMBULATORY_CARE_PROVIDER_SITE_OTHER): Payer: Medicare Other | Admitting: Surgery

## 2012-07-11 VITALS — BP 126/82 | HR 64 | Temp 97.8°F | Resp 16 | Ht 66.0 in | Wt 145.6 lb

## 2012-07-11 DIAGNOSIS — Z9889 Other specified postprocedural states: Secondary | ICD-10-CM

## 2012-07-11 NOTE — Patient Instructions (Signed)
Followup with Dr. Clarene Duke regarding synthyroid dosage. May massage neosporin cream into incision and leave open

## 2012-07-11 NOTE — Progress Notes (Signed)
Eustaquio Maize 66 y.o.  Body mass index is 23.50 kg/(m^2).  Patient Active Problem List  Diagnosis  . Cervical stenosis of spinal canal  . Nontoxic multinodular goiter  . Brachial neuritis or radiculitis NOS  . Essential hypertension, benign  . Internal hemorrhoids with other complication  . Reflux esophagitis    No Known Allergies  Past Surgical History  Procedure Date  . Knee surgery 1967    right  . Abdominal hysterectomy 03/2010  . Colonoscopy   . Breast ductal system excision 01/17/2012    Procedure: EXCISION DUCTAL SYSTEM BREAST;  Surgeon: Emelia Loron, MD;  Location: [facility];  Service: General;  Laterality: Right;  Right breast duct excision  . Thyroidectomy, partial 06/28/2012    Procedure: THYROIDECTOMY SUBTOTAL;  Surgeon: Valarie Merino, MD;  Location: [facility];  Service: General;  Laterality: Right;   Aida Puffer, MD No diagnosis found.  Ms Sigel came in today for first postoperative visit. She had a thyroidectomy were removed a large right retrosternal border and a large anterior nodule both of which on path or multinodular borders. She may need to be on Synthroid suppression for life. I will defer to Dr. Fayrene Fearing little on that and asked her to see him regarding that. Her incision is healing nicely and she is doing very well. She will see her hernia but care of an ear New Year's to discuss the cervical fusion needs.  I will be glad to see her again whenever needed. Return when necessary Matt B. Daphine Deutscher, MD, [facility] [phone] beeper [phone]  07/11/2012 12:10 PM

## 2012-08-20 ENCOUNTER — Encounter (INDEPENDENT_AMBULATORY_CARE_PROVIDER_SITE_OTHER): Payer: Self-pay

## 2012-09-25 ENCOUNTER — Other Ambulatory Visit: Payer: Self-pay | Admitting: Neurosurgery

## 2012-10-03 ENCOUNTER — Ambulatory Visit
Admission: RE | Admit: 2012-10-03 | Discharge: 2012-10-03 | Disposition: A | Discharge status: Home / Self Care | Payer: PRIVATE HEALTH INSURANCE | Source: Ambulatory Visit | Attending: Neurosurgery | Admitting: Neurosurgery

## 2012-12-19 ENCOUNTER — Other Ambulatory Visit (HOSPITAL_COMMUNITY): Payer: Self-pay | Admitting: Surgery

## 2012-12-23 NOTE — Telephone Encounter (Deleted)
Please advise if okay to refill. 

## 2012-12-23 NOTE — Telephone Encounter (Deleted)
Defer to Dr. Daphine Deutscher.  It is his pt.

## 2012-12-23 NOTE — Telephone Encounter (Deleted)
Dr. Martin please advise

## 2013-06-17 ENCOUNTER — Other Ambulatory Visit: Payer: Self-pay | Admitting: Obstetrics & Gynecology

## 2013-06-17 DIAGNOSIS — N6452 Nipple discharge: Secondary | ICD-10-CM

## 2013-07-02 ENCOUNTER — Ambulatory Visit
Admission: RE | Admit: 2013-07-02 | Discharge: 2013-07-02 | Disposition: A | Discharge status: Home / Self Care | Payer: Medicare Other | Source: Ambulatory Visit | Attending: Obstetrics & Gynecology | Admitting: Obstetrics & Gynecology

## 2013-07-02 DIAGNOSIS — N6452 Nipple discharge: Secondary | ICD-10-CM

## 2013-08-07 ENCOUNTER — Encounter (INDEPENDENT_AMBULATORY_CARE_PROVIDER_SITE_OTHER): Payer: Self-pay

## 2013-08-07 ENCOUNTER — Encounter (INDEPENDENT_AMBULATORY_CARE_PROVIDER_SITE_OTHER): Payer: Self-pay | Admitting: General Surgery

## 2013-08-07 ENCOUNTER — Ambulatory Visit (INDEPENDENT_AMBULATORY_CARE_PROVIDER_SITE_OTHER): Payer: Medicare Other | Admitting: General Surgery

## 2013-08-07 VITALS — BP 148/82 | HR 74 | Resp 16 | Ht 66.0 in | Wt 150.0 lb

## 2013-08-07 DIAGNOSIS — N6459 Other signs and symptoms in breast: Secondary | ICD-10-CM

## 2013-08-07 DIAGNOSIS — N6452 Nipple discharge: Secondary | ICD-10-CM

## 2013-08-07 NOTE — Patient Instructions (Signed)

## 2013-08-07 NOTE — Progress Notes (Signed)
Subjective:     Patient ID: Susan Ali, female   DOB: 04-Mar-1946, 68 y.o.   MRN: [id number]  HPI This is a 68 year old female that in June of 2013 I did a right breast duct excision for nipple discharge. Her pathology at that time returned as benign breast parenchyma with mildly dilated ducts and focal usual ductal hyperplasia. There is no atypia or malignancy. She since then has had her thyroid removed. In August and October of 2013 after surgery she had some clear discharge from the right side. This was fine until about a yearly ago in September she had 2 episodes of finding some yellow discharge on inside of her nightgown as well as in October and November. She has had nothing since then. She has not noticed any change in her exam at all. There is nothing on the other side. She underwent an evaluation at the breast center with the finding of a 6 x 4 x 5 mm cyst and some scarring in the region right greater prior surgery that is consistent with fat. Her mammogram and ultrasound were otherwise negative. A ductogram was performed where the discharging duct was identified in the lower inner quadrant of the right nipple. Initially there appeared to be some bubbles and when a reevaluation was performed there was no definite intraductal mass that was seen making this essentially a normal study. She comes in today to discuss her options.  Review of Systems     Objective:   Physical Exam  Vitals reviewed. Constitutional: She appears well-developed and well-nourished.  Pulmonary/Chest: Right breast exhibits no inverted nipple, no mass, no nipple discharge, no skin change and no tenderness. Left breast exhibits no inverted nipple, no mass, no nipple discharge, no skin change and no tenderness.    Lymphadenopathy:    She has no cervical adenopathy.    She has no axillary adenopathy.       Right: No supraclavicular adenopathy present.       Left: No supraclavicular adenopathy present.         Assessment:     RIght nipple discharge     Plan:     I dont really see anything concerning on her exam or her ductogram today. The discharge additionally has not occurred in a couple of months now. I think it would be reasonable to just follow this at this point we discussed that. She should come back and see me about 3-4 months. I asked her she has any other symptoms or any other changes especially bloody discharge to please call me sooner but a very low suspicion that there is anything concerning.

## 2014-05-28 ENCOUNTER — Other Ambulatory Visit: Payer: Self-pay | Admitting: Dermatology

## 2015-05-03 ENCOUNTER — Other Ambulatory Visit: Payer: Self-pay

## 2015-05-03 DIAGNOSIS — Z1231 Encounter for screening mammogram for malignant neoplasm of breast: Secondary | ICD-10-CM

## 2015-05-06 ENCOUNTER — Ambulatory Visit: Payer: Self-pay

## 2015-05-06 ENCOUNTER — Ambulatory Visit
Admission: RE | Admit: 2015-05-06 | Discharge: 2015-05-06 | Disposition: A | Discharge status: Home / Self Care | Payer: Medicare Other | Source: Ambulatory Visit

## 2015-05-06 DIAGNOSIS — Z1231 Encounter for screening mammogram for malignant neoplasm of breast: Secondary | ICD-10-CM

## 2015-12-30 ENCOUNTER — Other Ambulatory Visit: Payer: Self-pay | Admitting: Family Medicine

## 2015-12-30 DIAGNOSIS — H539 Unspecified visual disturbance: Secondary | ICD-10-CM

## 2016-01-08 ENCOUNTER — Ambulatory Visit
Admission: RE | Admit: 2016-01-08 | Discharge: 2016-01-08 | Disposition: A | Discharge status: Home / Self Care | Payer: Medicare Other | Source: Ambulatory Visit | Attending: Family Medicine | Admitting: Family Medicine

## 2016-01-08 DIAGNOSIS — H539 Unspecified visual disturbance: Secondary | ICD-10-CM

## 2017-08-14 ENCOUNTER — Other Ambulatory Visit: Payer: Self-pay | Admitting: Family Medicine

## 2017-08-14 DIAGNOSIS — R1013 Epigastric pain: Secondary | ICD-10-CM

## 2017-08-15 ENCOUNTER — Other Ambulatory Visit: Payer: Self-pay | Admitting: Family Medicine

## 2017-08-15 DIAGNOSIS — K8000 Calculus of gallbladder with acute cholecystitis without obstruction: Secondary | ICD-10-CM

## 2017-08-15 DIAGNOSIS — R1013 Epigastric pain: Secondary | ICD-10-CM

## 2017-08-21 ENCOUNTER — Ambulatory Visit
Admission: RE | Admit: 2017-08-21 | Discharge: 2017-08-21 | Disposition: A | Discharge status: Home / Self Care | Payer: Medicare Other | Source: Ambulatory Visit | Attending: Family Medicine | Admitting: Family Medicine

## 2017-08-21 DIAGNOSIS — K8000 Calculus of gallbladder with acute cholecystitis without obstruction: Secondary | ICD-10-CM

## 2017-08-21 DIAGNOSIS — R1013 Epigastric pain: Secondary | ICD-10-CM

## 2017-08-21 MED ORDER — IOPAMIDOL (ISOVUE-300) INJECTION 61%
100.0000 mL | Freq: Once | INTRAVENOUS | Status: AC | PRN
  Administered 2017-08-21: 100 mL via INTRAVENOUS

## 2017-08-24 ENCOUNTER — Ambulatory Visit
Admission: RE | Admit: 2017-08-24 | Discharge: 2017-08-24 | Disposition: A | Discharge status: Home / Self Care | Payer: Medicare Other | Source: Ambulatory Visit | Attending: Family Medicine | Admitting: Family Medicine

## 2017-08-24 DIAGNOSIS — R1013 Epigastric pain: Secondary | ICD-10-CM

## 2017-08-24 DIAGNOSIS — K8000 Calculus of gallbladder with acute cholecystitis without obstruction: Secondary | ICD-10-CM

## 2020-06-08 ENCOUNTER — Other Ambulatory Visit: Payer: Self-pay | Admitting: *Deleted

## 2020-06-08 DIAGNOSIS — M25562 Pain in left knee: Secondary | ICD-10-CM

## 2020-06-08 DIAGNOSIS — M25561 Pain in right knee: Secondary | ICD-10-CM

## 2020-06-21 ENCOUNTER — Encounter: Payer: Self-pay | Admitting: Surgery

## 2020-06-21 ENCOUNTER — Ambulatory Visit (HOSPITAL_COMMUNITY)
Admission: RE | Admit: 2020-06-21 | Discharge: 2020-06-21 | Disposition: A | Discharge status: Home / Self Care | Payer: Medicare PPO | Source: Ambulatory Visit | Attending: Surgery | Admitting: Surgery

## 2020-06-21 ENCOUNTER — Ambulatory Visit (INDEPENDENT_AMBULATORY_CARE_PROVIDER_SITE_OTHER): Payer: Medicare PPO | Admitting: Surgery

## 2020-06-21 ENCOUNTER — Other Ambulatory Visit: Payer: Self-pay

## 2020-06-21 VITALS — BP 137/81 | HR 61 | Temp 98.3°F | Resp 20 | Ht 66.0 in | Wt 171.0 lb

## 2020-06-21 DIAGNOSIS — M25561 Pain in right knee: Secondary | ICD-10-CM | POA: Insufficient documentation

## 2020-06-21 DIAGNOSIS — M25562 Pain in left knee: Secondary | ICD-10-CM

## 2020-06-21 NOTE — Progress Notes (Signed)
[facility]  Patient name: Susan Ali MRN: [id number] DOB: [birth year] Sex: female   REQUESTING PROVIDER:    Nile Riggs   REASON FOR CONSULT:    LE numbness and coolness  HISTORY OF PRESENT ILLNESS:   Susan Ali is a 74 y.o. female, who is referred for evaluation of bilateral lower extremity numbness and achiness up to her knees.  This has been going on for 1 to 2 years and has gotten worse.  She states that the pain improves with walking and gets worse with sitting down.  She will take NSAIDs occasionally for pain.  The patient was diagnosed last year with prediabetes with a hemoglobin A1c in the 6.1-6.5 range.  She has a history of spinal stenosis and has been evaluated by neurosurgery in the past.  She is a former smoker. 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   Thyroid disease    goiter-no meds   Thyroid nodule      FAMILY HISTORY   Family History  Problem Relation Age of Onset   Stroke Mother    Hypertension Father     SOCIAL HISTORY:   Social History   Socioeconomic History   Marital status: Single    Spouse name: Not on file   Number of children: Not on file   Years of education: Not on file   Highest education level: Not on file  Occupational History   Not on file  Tobacco Use   Smoking status: Former Smoker    Quit date: 07/25/1991    Years since quitting: 28.9   Smokeless tobacco: Never Used  Substance and Sexual  Activity   Alcohol use: No   Drug use: No   Sexual activity: Never  Other Topics Concern   Not on file  Social History Narrative   Not on file   Social Determinants of Health   Financial Resource Strain:    Difficulty of Paying Living Expenses: Not on file  Food Insecurity:    Worried About Charity fundraiser in the Last Year: Not on file   [employer] of Food in the Last Year: Not on file  Transportation Needs:    Lack of Transportation (Medical): Not on file   Lack of Transportation (Non-Medical): Not on file  Physical Activity:    Days of Exercise per Week: Not on file   Minutes of Exercise per Session: Not on file  Stress:    Feeling of Stress : Not on file  Social Connections:    Frequency of Communication with Friends and Family: Not on file   Frequency of Social Gatherings with Friends and Family: Not on file   Attends Religious Services: Not on file   Active Member of Clubs or Organizations: Not on file   Attends Archivist Meetings: Not on file   Marital Status: Not on file  Intimate Partner Violence:    Fear of  Current or Ex-Partner: Not on file   Emotionally Abused: Not on file   Physically Abused: Not on file   Sexually Abused: Not on file    ALLERGIES:    No Known Allergies  CURRENT MEDICATIONS:    Current Outpatient Medications  Medication Sig Dispense Refill   levothyroxine (SYNTHROID, LEVOTHROID) 100 MCG tablet Take 1 tablet (100 mcg total) by mouth daily before breakfast. 30 tablet 5   Multiple Vitamin (MULTIVITAMIN) capsule Take 1 capsule by mouth daily.     ramipril (ALTACE) 10 MG capsule Take 10 mg by mouth daily.     timolol (TIMOPTIC) 0.5 % ophthalmic solution Place 1 drop into both eyes daily.     No current facility-administered medications for this visit.    REVIEW OF SYSTEMS:   [X]  denotes positive finding, [ ]  denotes negative finding Cardiac  Comments:  Chest pain or chest pressure:    Shortness  of breath upon exertion:    Short of breath when lying flat:    Irregular heart rhythm:        Vascular    Pain in calf, thigh, or hip brought on by ambulation:    Pain in feet at night that wakes you up from your sleep:     Blood clot in your veins:    Leg swelling:         Pulmonary    Oxygen at home:    Productive cough:     Wheezing:         Neurologic    Sudden weakness in arms or legs:     Sudden numbness in arms or legs:  x   Sudden onset of difficulty speaking or slurred speech:    Temporary loss of vision in one eye:     Problems with dizziness:         Gastrointestinal    Blood in stool:      Vomited blood:         Genitourinary    Burning when urinating:     Blood in urine:        Psychiatric    Major depression:         Hematologic    Bleeding problems:    Problems with blood clotting too easily:        Skin    Rashes or ulcers:        Constitutional    Fever or chills:     PHYSICAL EXAM:   There were no vitals filed for this visit.  GENERAL: The patient is a well-nourished female, in no acute distress. The vital signs are documented above. CARDIAC: There is a regular rate and rhythm.  VASCULAR: Palpable dorsalis pedis pulse bilaterally.  Palpable radial pulses bilaterally. PULMONARY: Nonlabored respirations ABDOMEN: Soft and non-tender.  No pulsatile mass MUSCULOSKELETAL: There are no major deformities or cyanosis. NEUROLOGIC: No focal weakness or paresthesias are detected. SKIN: There are no ulcers or rashes noted. PSYCHIATRIC: The patient has a normal affect.  STUDIES:   I have reviewed the following: +-------+-----------+-----------+------------+------------+   ABI/TBI Today's ABI Today's TBI Previous ABI Previous TBI   +-------+-----------+-----------+------------+------------+   Right  1.21     0.76                     +-------+-----------+-----------+------------+------------+   Left   1.17     0.63                      +-------+-----------+-----------+------------+------------+  Right toe pressure:  112 Left toe pressure:  93  ASSESSMENT and PLAN   Left leg numbness: On exam, the patient has palpable pedal pulses.  Her ABIs were within normal range with normal toe pressures and triphasic waveforms.  I discussed with the patient that I do not feel her symptoms are vascular in etiology.  This potentially could be neuropathy secondary to diabetes or potentially related to her spinal stenosis.  I have encouraged her to follow-up with her primary care physician for further evaluation of her diabetes.  I am also making referral to neurosurgery for further evaluation of her spinal stenosis.  She saw Dr. Joya Salm several years ago who had recommended urgent surgery.  She got a second opinion from a neurosurgeon in [facility] who is no longer practicing.  Recommend conservative therapy.   Leia Alf, MD, FACS [facility] [phone] Pager [phone]

## 2020-08-27 ENCOUNTER — Other Ambulatory Visit: Payer: Self-pay | Admitting: Neurosurgery

## 2020-08-27 DIAGNOSIS — M4712 Other spondylosis with myelopathy, cervical region: Secondary | ICD-10-CM

## 2020-08-27 DIAGNOSIS — G8929 Other chronic pain: Secondary | ICD-10-CM

## 2020-09-14 ENCOUNTER — Ambulatory Visit
Admission: RE | Admit: 2020-09-14 | Discharge: 2020-09-14 | Disposition: A | Discharge status: Home / Self Care | Payer: Medicare PPO | Source: Ambulatory Visit | Attending: Neurosurgery | Admitting: Neurosurgery

## 2020-09-14 DIAGNOSIS — M4712 Other spondylosis with myelopathy, cervical region: Secondary | ICD-10-CM

## 2020-09-14 DIAGNOSIS — M5442 Lumbago with sciatica, left side: Secondary | ICD-10-CM

## 2020-09-14 DIAGNOSIS — M4722 Other spondylosis with radiculopathy, cervical region: Secondary | ICD-10-CM

## 2020-09-14 DIAGNOSIS — G8929 Other chronic pain: Secondary | ICD-10-CM

## 2020-09-18 ENCOUNTER — Other Ambulatory Visit: Payer: Medicare PPO

## 2021-06-29 ENCOUNTER — Other Ambulatory Visit: Payer: Self-pay | Admitting: Neurosurgery

## 2021-07-07 ENCOUNTER — Other Ambulatory Visit: Payer: Self-pay | Admitting: Neurosurgery

## 2021-07-15 NOTE — Progress Notes (Addendum)
Surgical Instructions    Your procedure is scheduled on 07/21/21.  Report to [facility] Main Entrance "A" at 9:45 A.M., then check in with the Admitting office.  Call this number if you have problems the morning of surgery:  [phone]   If you have any questions prior to your surgery date call [phone]: Open Monday-Friday 8am-4pm    Remember:  Do not eat or drink after midnight the night before your surgery     Take these medicines the morning of surgery with A SIP OF WATER:  timolol (TIMOPTIC)  Propylene Glycol (SYSTANE BALANCE) - if needed   As of today, STOP taking any Aspirin (unless otherwise instructed by your surgeon) Aleve, Naproxen, Ibuprofen, Motrin, Advil, Goody's, BC's, all herbal medications, fish oil, and all vitamins.   After your COVID test   You are not required to quarantine however you are required to wear a well-fitting mask when you are out and around people not in your household.  If your mask becomes wet or soiled, replace with a new one.  Wash your hands often with soap and water for 20 seconds or clean your hands with an alcohol-based hand sanitizer that contains at least 60% alcohol.  Do not share personal items.  Notify your provider: if you are in close contact with someone who has COVID  or if you develop a fever of 100.4 or greater, sneezing, cough, sore throat, shortness of breath or body aches.           Do not wear jewelry or makeup Do not wear lotions, powders, perfumes or deodorant. Do not shave 48 hours prior to surgery.   Do not bring valuables to the hospital. DO Not wear nail polish, gel polish, artificial nails, or any other type of covering on natural nails including finger and toenails. If patients have artificial nails, gel coating, etc. that need to be removed by a nail salon, please have this removed prior to surgery or surgery may need to be canceled/delayed if the surgeon/ anesthesia feels like the patient is unable to be  adequately monitored.             [facility] is not responsible for any belongings or valuables.  Do NOT Smoke (Tobacco/Vaping)  24 hours prior to your procedure  If you use a CPAP at night, you may bring your mask for your overnight stay.   Contacts, glasses, hearing aids, dentures or partials may not be worn into surgery, please bring cases for these belongings   For patients admitted to the hospital, discharge time will be determined by your treatment team.   Patients discharged the day of surgery will not be allowed to drive home, and someone needs to stay with them for 24 hours.  NO VISITORS WILL BE ALLOWED IN PRE-OP WHERE PATIENTS ARE PREPPED FOR SURGERY.  ONLY 1 SUPPORT PERSON MAY BE PRESENT IN THE WAITING ROOM WHILE YOU ARE IN SURGERY.  IF YOU ARE TO BE ADMITTED, ONCE YOU ARE IN YOUR ROOM YOU WILL BE ALLOWED TWO (2) VISITORS. 1 (ONE) VISITOR MAY STAY OVERNIGHT BUT MUST ARRIVE TO THE ROOM BY 8pm.  Minor children may have two parents present. Special consideration for safety and communication needs will be reviewed on a case by case basis.  Special instructions:    Oral Hygiene is also important to reduce your risk of infection.  Remember - BRUSH YOUR TEETH THE MORNING OF SURGERY WITH YOUR REGULAR TOOTHPASTE   [place]- Preparing For Surgery  Before surgery, you can play an important role. Because skin is not sterile, your skin needs to be as free of germs as possible. You can reduce the number of germs on your skin by washing with CHG (chlorahexidine gluconate) Soap before surgery.  CHG is an antiseptic cleaner which kills germs and bonds with the skin to continue killing germs even after washing.     Please do not use if you have an allergy to CHG or antibacterial soaps. If your skin becomes reddened/irritated stop using the CHG.  Do not shave (including legs and underarms) for at least 48 hours prior to first CHG shower. It is OK to shave your face.  Please follow these  instructions carefully.     Shower the NIGHT BEFORE SURGERY and the MORNING OF SURGERY with CHG Soap.   If you chose to wash your hair, wash your hair first as usual with your normal shampoo. After you shampoo, rinse your hair and body thoroughly to remove the shampoo.  Then [employer] and genitals (private parts) with your normal soap and rinse thoroughly to remove soap.  After that Use CHG Soap as you would any other liquid soap. You can apply CHG directly to the skin and wash gently with a scrungie or a clean washcloth.   Apply the CHG Soap to your body ONLY FROM THE NECK DOWN.  Do not use on open wounds or open sores. Avoid contact with your eyes, ears, mouth and genitals (private parts). Wash Face and genitals (private parts)  with your normal soap.   Wash thoroughly, paying special attention to the area where your surgery will be performed.  Thoroughly rinse your body with warm water from the neck down.  DO NOT shower/wash with your normal soap after using and rinsing off the CHG Soap.  Pat yourself dry with a CLEAN TOWEL.  Wear CLEAN PAJAMAS to bed the night before surgery  Place CLEAN SHEETS on your bed the night before your surgery  DO NOT SLEEP WITH PETS.   Day of Surgery:  Take a shower with CHG soap. Wear Clean/Comfortable clothing the morning of surgery Do not apply any deodorants/lotions.   Remember to brush your teeth WITH YOUR REGULAR TOOTHPASTE.   Please read over the following fact sheets that you were given.

## 2021-07-19 ENCOUNTER — Other Ambulatory Visit: Payer: Self-pay

## 2021-07-19 ENCOUNTER — Encounter (HOSPITAL_COMMUNITY): Payer: Self-pay

## 2021-07-19 ENCOUNTER — Encounter (HOSPITAL_COMMUNITY)
Admission: RE | Admit: 2021-07-19 | Discharge: 2021-07-19 | Disposition: A | Discharge status: Home / Self Care | Payer: Medicare PPO | Source: Ambulatory Visit | Attending: Neurosurgery | Admitting: Neurosurgery

## 2021-07-19 VITALS — BP 149/81 | HR 65 | Temp 98.0°F | Resp 18 | Ht 65.0 in | Wt 173.9 lb

## 2021-07-19 DIAGNOSIS — Z01818 Encounter for other preprocedural examination: Secondary | ICD-10-CM | POA: Insufficient documentation

## 2021-07-19 DIAGNOSIS — U071 COVID-19: Secondary | ICD-10-CM | POA: Insufficient documentation

## 2021-07-19 DIAGNOSIS — R7303 Prediabetes: Secondary | ICD-10-CM | POA: Diagnosis not present

## 2021-07-19 LAB — CBC
HCT: 39.9 % (ref 36.0–46.0)
Hemoglobin: 13 g/dL (ref 12.0–15.0)
MCH: 29.9 pg (ref 26.0–34.0)
MCHC: 32.6 g/dL (ref 30.0–36.0)
MCV: 91.7 fL (ref 80.0–100.0)
Platelets: 328 10*3/uL (ref 150–400)
RBC: 4.35 MIL/uL (ref 3.87–5.11)
RDW: 13.8 % (ref 11.5–15.5)
WBC: 6.9 10*3/uL (ref 4.0–10.5)
nRBC: 0 % (ref 0.0–0.2)

## 2021-07-19 LAB — BASIC METABOLIC PANEL
Anion gap: 10 (ref 5–15)
BUN: 10 mg/dL (ref 8–23)
CO2: 24 mmol/L (ref 22–32)
Calcium: 9.1 mg/dL (ref 8.9–10.3)
Chloride: 100 mmol/L (ref 98–111)
Creatinine, Ser: 0.89 mg/dL (ref 0.44–1.00)
GFR, Estimated: >60 mL/min (ref >60)
Glucose, Bld: 114 mg/dL — ABNORMAL HIGH (ref 70–99)
Potassium: 3.6 mmol/L (ref 3.5–5.1)
Sodium: 134 mmol/L — ABNORMAL LOW (ref 135–145)

## 2021-07-19 LAB — TYPE AND SCREEN
ABO/RH(D): A NEG
Antibody Screen: NEGATIVE

## 2021-07-19 LAB — SURGICAL PCR SCREEN
MRSA, PCR: NEGATIVE
Staphylococcus aureus: NEGATIVE

## 2021-07-19 LAB — GLUCOSE, CAPILLARY: Glucose-Capillary: 128 mg/dL — ABNORMAL HIGH (ref 70–99)

## 2021-07-19 LAB — HEMOGLOBIN A1C
Hgb A1c MFr Bld: 6.3 % — ABNORMAL HIGH (ref 4.8–5.6)
Mean Plasma Glucose: 134.11 mg/dL

## 2021-07-19 LAB — SARS CORONAVIRUS 2 (TAT 6-24 HRS): SARS Coronavirus 2: POSITIVE — AB

## 2021-07-19 NOTE — Progress Notes (Signed)
PCP: Tamsen Roers, MD Cardiologist: denies  EKG: 07/19/21 CXR: 06/24/12 ECHO:denies Stress Test: denies Cardiac Cath:denies  Fasting Blood Sugar- does not check glucose.  Pre-diabetic Checks Blood Sugar__0_ times a day  OSA/CPAP: No  ASA/Blood Thinner: No  Covid test 07/19/21 at PAT.  Patient states has sore throat.  Anesthesia Review: No  Patient denies shortness of breath, fever, cough, and chest pain at PAT appointment.  Patient verbalized understanding of instructions provided today at the PAT appointment.  Patient asked to review instructions at home and day of surgery.

## 2021-07-19 NOTE — Progress Notes (Signed)
Left message for [name], CMA for Dr. Arnoldo Morale.  Message stated that patient's Covid test came back positive today and patient is symptomatic with a sore throat.  Asked [name] to call me back to confirm received message.

## 2021-08-11 ENCOUNTER — Encounter (HOSPITAL_COMMUNITY): Payer: Self-pay | Admitting: Neurosurgery

## 2021-08-11 ENCOUNTER — Other Ambulatory Visit: Payer: Self-pay

## 2021-08-11 NOTE — Progress Notes (Signed)
Surgical Instructions    Your procedure is scheduled on 08/15/21  Report to [facility] Main Entrance "A" at 10:15 A.M., then check in with the Admitting office.  Call this number if you have problems the morning of surgery:  [phone]   If you have any questions prior to your surgery date call [phone]: Open Monday-Friday 8am-4pm    Remember:  Do not eat or drink after midnight the night before your surgery     Take these medicines the morning of surgery with A SIP OF WATER:  timolol (TIMOPTIC)  Propylene Glycol (SYSTANE BALANCE) - if needed   As of today, STOP taking any Aspirin (unless otherwise instructed by your surgeon) Aleve, Naproxen, Ibuprofen, Motrin, Advil, Goody's, BC's, all herbal medications, fish oil, and all vitamins.   After your COVID test   You are not required to quarantine however you are required to wear a well-fitting mask when you are out and around people not in your household.  If your mask becomes wet or soiled, replace with a new one.  Wash your hands often with soap and water for 20 seconds or clean your hands with an alcohol-based hand sanitizer that contains at least 60% alcohol.  Do not share personal items.  Notify your provider: if you are in close contact with someone who has COVID  or if you develop a fever of 100.4 or greater, sneezing, cough, sore throat, shortness of breath or body aches.           Do not wear jewelry or makeup Do not wear lotions, powders, perfumes or deodorant. Do not shave 48 hours prior to surgery.   Do not bring valuables to the hospital. DO Not wear nail polish, gel polish, artificial nails, or any other type of covering on natural nails including finger and toenails. If patients have artificial nails, gel coating, etc. that need to be removed by a nail salon, please have this removed prior to surgery or surgery may need to be canceled/delayed if the surgeon/ anesthesia feels like the patient is unable to be  adequately monitored.             [place] is not responsible for any belongings or valuables.  Do NOT Smoke (Tobacco/Vaping)  24 hours prior to your procedure  If you use a CPAP at night, you may bring your mask for your overnight stay.   Contacts, glasses, hearing aids, dentures or partials may not be worn into surgery, please bring cases for these belongings   For patients admitted to the hospital, discharge time will be determined by your treatment team.   Patients discharged the day of surgery will not be allowed to drive home, and someone needs to stay with them for 24 hours.  NO VISITORS WILL BE ALLOWED IN PRE-OP WHERE PATIENTS ARE PREPPED FOR SURGERY.  ONLY 1 SUPPORT PERSON MAY BE PRESENT IN THE WAITING ROOM WHILE YOU ARE IN SURGERY.  IF YOU ARE TO BE ADMITTED, ONCE YOU ARE IN YOUR ROOM YOU WILL BE ALLOWED TWO (2) VISITORS. 1 (ONE) VISITOR MAY STAY OVERNIGHT BUT MUST ARRIVE TO THE ROOM BY 8pm.  Minor children may have two parents present. Special consideration for safety and communication needs will be reviewed on a case by case basis.  Special instructions:    Oral Hygiene is also important to reduce your risk of infection.  Remember - BRUSH YOUR TEETH THE MORNING OF SURGERY WITH YOUR REGULAR TOOTHPASTE   - Preparing For Surgery  Before surgery, you can play an important role. Because skin is not sterile, your skin needs to be as free of germs as possible. You can reduce the number of germs on your skin by washing with CHG (chlorahexidine gluconate) Soap before surgery.  CHG is an antiseptic cleaner which kills germs and bonds with the skin to continue killing germs even after washing.     Please do not use if you have an allergy to CHG or antibacterial soaps. If your skin becomes reddened/irritated stop using the CHG.  Do not shave (including legs and underarms) for at least 48 hours prior to first CHG shower. It is OK to shave your face.  Please follow these  instructions carefully.     Shower the NIGHT BEFORE SURGERY and the MORNING OF SURGERY with CHG Soap.   If you chose to wash your hair, wash your hair first as usual with your normal shampoo. After you shampoo, rinse your hair and body thoroughly to remove the shampoo.  Then [employer] and genitals (private parts) with your normal soap and rinse thoroughly to remove soap.  After that Use CHG Soap as you would any other liquid soap. You can apply CHG directly to the skin and wash gently with a scrungie or a clean washcloth.   Apply the CHG Soap to your body ONLY FROM THE NECK DOWN.  Do not use on open wounds or open sores. Avoid contact with your eyes, ears, mouth and genitals (private parts). Wash Face and genitals (private parts)  with your normal soap.   Wash thoroughly, paying special attention to the area where your surgery will be performed.  Thoroughly rinse your body with warm water from the neck down.  DO NOT shower/wash with your normal soap after using and rinsing off the CHG Soap.  Pat yourself dry with a CLEAN TOWEL.  Wear CLEAN PAJAMAS to bed the night before surgery  Place CLEAN SHEETS on your bed the night before your surgery  DO NOT SLEEP WITH PETS.   Day of Surgery:  Take a shower with CHG soap. Wear Clean/Comfortable clothing the morning of surgery Do not apply any deodorants/lotions.   Remember to brush your teeth WITH YOUR REGULAR TOOTHPASTE.   Please read over the following fact sheets that you were given.

## 2021-08-15 ENCOUNTER — Inpatient Hospital Stay (HOSPITAL_COMMUNITY): Payer: Medicare PPO

## 2021-08-15 ENCOUNTER — Encounter (HOSPITAL_COMMUNITY)
Admission: RE | Disposition: A | Discharge status: Home / Self Care | Payer: Self-pay | Source: Ambulatory Visit | Attending: Neurosurgery

## 2021-08-15 ENCOUNTER — Other Ambulatory Visit: Payer: Self-pay

## 2021-08-15 ENCOUNTER — Inpatient Hospital Stay (HOSPITAL_COMMUNITY): Payer: Medicare PPO | Admitting: Anesthesiology

## 2021-08-15 ENCOUNTER — Inpatient Hospital Stay (HOSPITAL_COMMUNITY)
Admission: RE | Admit: 2021-08-15 | Discharge: 2021-08-16 | DRG: 473 | Disposition: A | Discharge status: Home / Self Care | Payer: Medicare PPO | Source: Ambulatory Visit | Attending: Neurosurgery | Admitting: Neurosurgery

## 2021-08-15 ENCOUNTER — Encounter (HOSPITAL_COMMUNITY): Payer: Self-pay | Admitting: Neurosurgery

## 2021-08-15 DIAGNOSIS — I10 Essential (primary) hypertension: Secondary | ICD-10-CM | POA: Diagnosis present

## 2021-08-15 DIAGNOSIS — Z79899 Other long term (current) drug therapy: Secondary | ICD-10-CM

## 2021-08-15 DIAGNOSIS — Z8249 Family history of ischemic heart disease and other diseases of the circulatory system: Secondary | ICD-10-CM | POA: Diagnosis not present

## 2021-08-15 DIAGNOSIS — Z87891 Personal history of nicotine dependence: Secondary | ICD-10-CM

## 2021-08-15 DIAGNOSIS — M4802 Spinal stenosis, cervical region: Secondary | ICD-10-CM | POA: Diagnosis present

## 2021-08-15 DIAGNOSIS — Z823 Family history of stroke: Secondary | ICD-10-CM

## 2021-08-15 DIAGNOSIS — Z85828 Personal history of other malignant neoplasm of skin: Secondary | ICD-10-CM | POA: Diagnosis not present

## 2021-08-15 DIAGNOSIS — M4722 Other spondylosis with radiculopathy, cervical region: Principal | ICD-10-CM | POA: Diagnosis present

## 2021-08-15 DIAGNOSIS — Z419 Encounter for procedure for purposes other than remedying health state, unspecified: Secondary | ICD-10-CM

## 2021-08-15 HISTORY — PX: ANTERIOR CERVICAL DECOMP/DISCECTOMY FUSION: SHX1161

## 2021-08-15 LAB — TYPE AND SCREEN
ABO/RH(D): A NEG
Antibody Screen: NEGATIVE

## 2021-08-15 SURGERY — ANTERIOR CERVICAL DECOMPRESSION/DISCECTOMY FUSION 3 LEVELS
Anesthesia: General | Site: Neck

## 2021-08-15 MED ORDER — LIDOCAINE 2% (20 MG/ML) 5 ML SYRINGE
INTRAMUSCULAR | Status: AC
  Filled 2021-08-15: qty 5

## 2021-08-15 MED ORDER — HYDROCHLOROTHIAZIDE 25 MG PO TABS: 25.0000 mg | ORAL_TABLET | Freq: Every day | ORAL | Status: DC

## 2021-08-15 MED ORDER — BUPIVACAINE-EPINEPHRINE 0.5% -1:200000 IJ SOLN
INTRAMUSCULAR | Status: AC
  Filled 2021-08-15: qty 1

## 2021-08-15 MED ORDER — ONDANSETRON HCL 4 MG/2ML IJ SOLN
INTRAMUSCULAR | Status: DC | PRN
Start: 2021-08-15 — End: 2021-08-15
  Administered 2021-08-15: 4 mg via INTRAVENOUS

## 2021-08-15 MED ORDER — LACTATED RINGERS IV SOLN: INTRAVENOUS | Status: DC | PRN

## 2021-08-15 MED ORDER — CHLORHEXIDINE GLUCONATE CLOTH 2 % EX PADS: 6.0000 | MEDICATED_PAD | Freq: Once | CUTANEOUS | Status: DC

## 2021-08-15 MED ORDER — CEFAZOLIN SODIUM-DEXTROSE 2-4 GM/100ML-% IV SOLN
2.0000 g | INTRAVENOUS | Status: AC
  Administered 2021-08-15: 2 g via INTRAVENOUS
  Filled 2021-08-15: qty 100

## 2021-08-15 MED ORDER — ONDANSETRON HCL 4 MG/2ML IJ SOLN
INTRAMUSCULAR | Status: AC
  Filled 2021-08-15: qty 2

## 2021-08-15 MED ORDER — OXYCODONE HCL 5 MG PO TABS
10.0000 mg | ORAL_TABLET | ORAL | Status: DC | PRN
  Administered 2021-08-15 (×2): 10 mg via ORAL
  Filled 2021-08-15 (×2): qty 2

## 2021-08-15 MED ORDER — SUCCINYLCHOLINE CHLORIDE 200 MG/10ML IV SOSY
PREFILLED_SYRINGE | INTRAVENOUS | Status: DC | PRN
  Administered 2021-08-15: 100 mg via INTRAVENOUS

## 2021-08-15 MED ORDER — ALUM & MAG HYDROXIDE-SIMETH 200-200-20 MG/5ML PO SUSP: 30.0000 mL | Freq: Four times a day (QID) | ORAL | Status: DC | PRN

## 2021-08-15 MED ORDER — ZOLPIDEM TARTRATE 5 MG PO TABS: 5.0000 mg | ORAL_TABLET | Freq: Every evening | ORAL | Status: DC | PRN

## 2021-08-15 MED ORDER — GLYCOPYRROLATE PF 0.2 MG/ML IJ SOSY
PREFILLED_SYRINGE | INTRAMUSCULAR | Status: AC
  Filled 2021-08-15: qty 1

## 2021-08-15 MED ORDER — CYCLOBENZAPRINE HCL 10 MG PO TABS
10.0000 mg | ORAL_TABLET | Freq: Three times a day (TID) | ORAL | Status: DC | PRN
  Administered 2021-08-15 – 2021-08-16 (×2): 10 mg via ORAL
  Filled 2021-08-15 (×2): qty 1

## 2021-08-15 MED ORDER — PHENYLEPHRINE 40 MCG/ML (10ML) SYRINGE FOR IV PUSH (FOR BLOOD PRESSURE SUPPORT)
PREFILLED_SYRINGE | INTRAVENOUS | Status: DC | PRN
  Administered 2021-08-15: 80 ug via INTRAVENOUS

## 2021-08-15 MED ORDER — DEXAMETHASONE SODIUM PHOSPHATE 4 MG/ML IJ SOLN
4.0000 mg | Freq: Four times a day (QID) | INTRAMUSCULAR | Status: AC
  Administered 2021-08-15 – 2021-08-16 (×2): 4 mg via INTRAVENOUS
  Filled 2021-08-15 (×2): qty 1

## 2021-08-15 MED ORDER — MENTHOL 3 MG MT LOZG: 1.0000 | LOZENGE | OROMUCOSAL | Status: DC | PRN

## 2021-08-15 MED ORDER — THROMBIN 5000 UNITS EX SOLR: OROMUCOSAL | Status: DC | PRN

## 2021-08-15 MED ORDER — TIMOLOL MALEATE 0.5 % OP SOLN
1.0000 [drp] | Freq: Every day | OPHTHALMIC | Status: DC
  Filled 2021-08-15: qty 5

## 2021-08-15 MED ORDER — FENTANYL CITRATE (PF) 250 MCG/5ML IJ SOLN
INTRAMUSCULAR | Status: DC | PRN
  Administered 2021-08-15 (×3): 50 ug via INTRAVENOUS

## 2021-08-15 MED ORDER — MORPHINE SULFATE (PF) 4 MG/ML IV SOLN: 4.0000 mg | INTRAVENOUS | Status: DC | PRN

## 2021-08-15 MED ORDER — CEFAZOLIN SODIUM-DEXTROSE 2-4 GM/100ML-% IV SOLN
2.0000 g | Freq: Three times a day (TID) | INTRAVENOUS | Status: AC
  Administered 2021-08-15 – 2021-08-16 (×2): 2 g via INTRAVENOUS
  Filled 2021-08-15 (×2): qty 100

## 2021-08-15 MED ORDER — EPHEDRINE 5 MG/ML INJ
INTRAVENOUS | Status: AC
  Filled 2021-08-15: qty 5

## 2021-08-15 MED ORDER — ACETAMINOPHEN 500 MG PO TABS
1000.0000 mg | ORAL_TABLET | Freq: Four times a day (QID) | ORAL | Status: DC
  Administered 2021-08-15: 1000 mg via ORAL
  Filled 2021-08-15 (×2): qty 2

## 2021-08-15 MED ORDER — DEXAMETHASONE SODIUM PHOSPHATE 10 MG/ML IJ SOLN
INTRAMUSCULAR | Status: AC
  Filled 2021-08-15: qty 1

## 2021-08-15 MED ORDER — ACETAMINOPHEN 650 MG RE SUPP: 650.0000 mg | RECTAL | Status: DC | PRN

## 2021-08-15 MED ORDER — ROCURONIUM BROMIDE 10 MG/ML (PF) SYRINGE
PREFILLED_SYRINGE | INTRAVENOUS | Status: DC | PRN
  Administered 2021-08-15: 50 mg via INTRAVENOUS
  Administered 2021-08-15: 20 mg via INTRAVENOUS
  Administered 2021-08-15: 10 mg via INTRAVENOUS
  Administered 2021-08-15: 20 mg via INTRAVENOUS

## 2021-08-15 MED ORDER — POLYVINYL ALCOHOL 1.4 % OP SOLN
1.0000 [drp] | Freq: Every day | OPHTHALMIC | Status: DC | PRN
  Filled 2021-08-15: qty 15

## 2021-08-15 MED ORDER — LACTATED RINGERS IV SOLN: INTRAVENOUS | Status: DC

## 2021-08-15 MED ORDER — LOSARTAN POTASSIUM 50 MG PO TABS
25.0000 mg | ORAL_TABLET | Freq: Every day | ORAL | Status: DC
  Administered 2021-08-15: 25 mg via ORAL
  Filled 2021-08-15: qty 1

## 2021-08-15 MED ORDER — PHENYLEPHRINE HCL-NACL 20-0.9 MG/250ML-% IV SOLN
INTRAVENOUS | Status: DC | PRN
  Administered 2021-08-15: 40 ug/min via INTRAVENOUS

## 2021-08-15 MED ORDER — THROMBIN 20000 UNITS EX SOLR
CUTANEOUS | Status: AC
  Filled 2021-08-15: qty 20000

## 2021-08-15 MED ORDER — SUCCINYLCHOLINE CHLORIDE 200 MG/10ML IV SOSY
PREFILLED_SYRINGE | INTRAVENOUS | Status: AC
  Filled 2021-08-15: qty 10

## 2021-08-15 MED ORDER — ACETAMINOPHEN 325 MG PO TABS: 650.0000 mg | ORAL_TABLET | ORAL | Status: DC | PRN

## 2021-08-15 MED ORDER — PHENOL 1.4 % MT LIQD: 1.0000 | OROMUCOSAL | Status: DC | PRN

## 2021-08-15 MED ORDER — ORAL CARE MOUTH RINSE: 15.0000 mL | Freq: Once | OROMUCOSAL | Status: AC

## 2021-08-15 MED ORDER — ONDANSETRON HCL 4 MG/2ML IJ SOLN
4.0000 mg | Freq: Four times a day (QID) | INTRAMUSCULAR | Status: DC | PRN
  Administered 2021-08-16: 01:00:00 4 mg via INTRAVENOUS
  Filled 2021-08-15: qty 2

## 2021-08-15 MED ORDER — FENTANYL CITRATE (PF) 250 MCG/5ML IJ SOLN
INTRAMUSCULAR | Status: AC
  Filled 2021-08-15: qty 5

## 2021-08-15 MED ORDER — 0.9 % SODIUM CHLORIDE (POUR BTL) OPTIME
TOPICAL | Status: DC | PRN
  Administered 2021-08-15: 1000 mL

## 2021-08-15 MED ORDER — ONDANSETRON HCL 4 MG PO TABS: 4.0000 mg | ORAL_TABLET | Freq: Four times a day (QID) | ORAL | Status: DC | PRN

## 2021-08-15 MED ORDER — BACITRACIN ZINC 500 UNIT/GM EX OINT
TOPICAL_OINTMENT | CUTANEOUS | Status: AC
  Filled 2021-08-15: qty 28.35

## 2021-08-15 MED ORDER — BISACODYL 10 MG RE SUPP: 10.0000 mg | Freq: Every day | RECTAL | Status: DC | PRN

## 2021-08-15 MED ORDER — DEXAMETHASONE 4 MG PO TABS: 4.0000 mg | ORAL_TABLET | Freq: Four times a day (QID) | ORAL | Status: AC

## 2021-08-15 MED ORDER — LIDOCAINE 2% (20 MG/ML) 5 ML SYRINGE
INTRAMUSCULAR | Status: DC | PRN
Start: 2021-08-15 — End: 2021-08-15
  Administered 2021-08-15: 100 mg via INTRAVENOUS

## 2021-08-15 MED ORDER — OXYCODONE HCL 5 MG PO TABS: 5.0000 mg | ORAL_TABLET | ORAL | Status: DC | PRN

## 2021-08-15 MED ORDER — ROCURONIUM BROMIDE 10 MG/ML (PF) SYRINGE
PREFILLED_SYRINGE | INTRAVENOUS | Status: AC
  Filled 2021-08-15: qty 10

## 2021-08-15 MED ORDER — BUPIVACAINE-EPINEPHRINE (PF) 0.5% -1:200000 IJ SOLN
INTRAMUSCULAR | Status: DC | PRN
  Administered 2021-08-15: 10 mL

## 2021-08-15 MED ORDER — BACITRACIN ZINC 500 UNIT/GM EX OINT
TOPICAL_OINTMENT | CUTANEOUS | Status: DC | PRN
  Administered 2021-08-15: 1 via TOPICAL

## 2021-08-15 MED ORDER — CHLORHEXIDINE GLUCONATE 0.12 % MT SOLN: 15.0000 mL | Freq: Once | OROMUCOSAL | Status: AC

## 2021-08-15 MED ORDER — CHLORHEXIDINE GLUCONATE 0.12 % MT SOLN
OROMUCOSAL | Status: AC
  Administered 2021-08-15: 15 mL via OROMUCOSAL
  Filled 2021-08-15: qty 15

## 2021-08-15 MED ORDER — LATANOPROST 0.005 % OP SOLN
1.0000 [drp] | Freq: Every day | OPHTHALMIC | Status: DC
  Filled 2021-08-15: qty 2.5

## 2021-08-15 MED ORDER — GLYCOPYRROLATE PF 0.2 MG/ML IJ SOSY
PREFILLED_SYRINGE | INTRAMUSCULAR | Status: DC | PRN
  Administered 2021-08-15 (×2): .1 mg via INTRAVENOUS

## 2021-08-15 MED ORDER — DOCUSATE SODIUM 100 MG PO CAPS
100.0000 mg | ORAL_CAPSULE | Freq: Two times a day (BID) | ORAL | Status: DC
  Administered 2021-08-15: 100 mg via ORAL
  Filled 2021-08-15: qty 1

## 2021-08-15 MED ORDER — THROMBIN 20000 UNITS EX SOLR: CUTANEOUS | Status: DC | PRN

## 2021-08-15 MED ORDER — THROMBIN 5000 UNITS EX SOLR
CUTANEOUS | Status: AC
  Filled 2021-08-15: qty 5000

## 2021-08-15 MED ORDER — DEXAMETHASONE SODIUM PHOSPHATE 10 MG/ML IJ SOLN
INTRAMUSCULAR | Status: DC | PRN
  Administered 2021-08-15: 10 mg via INTRAVENOUS

## 2021-08-15 MED ORDER — HYDROMORPHONE HCL 1 MG/ML IJ SOLN: 0.2500 mg | INTRAMUSCULAR | Status: DC | PRN

## 2021-08-15 MED ORDER — PANTOPRAZOLE SODIUM 40 MG IV SOLR
40.0000 mg | Freq: Every day | INTRAVENOUS | Status: DC
  Administered 2021-08-15: 40 mg via INTRAVENOUS
  Filled 2021-08-15: qty 40

## 2021-08-15 MED ORDER — EPHEDRINE SULFATE-NACL 50-0.9 MG/10ML-% IV SOSY
PREFILLED_SYRINGE | INTRAVENOUS | Status: DC | PRN
  Administered 2021-08-15 (×2): 2.5 mg via INTRAVENOUS

## 2021-08-15 MED ORDER — PROPOFOL 10 MG/ML IV BOLUS
INTRAVENOUS | Status: DC | PRN
  Administered 2021-08-15: 160 mg via INTRAVENOUS

## 2021-08-15 MED ORDER — PROPOFOL 10 MG/ML IV BOLUS
INTRAVENOUS | Status: AC
  Filled 2021-08-15: qty 20

## 2021-08-15 MED ORDER — SUGAMMADEX SODIUM 200 MG/2ML IV SOLN
INTRAVENOUS | Status: DC | PRN
  Administered 2021-08-15: 200 mg via INTRAVENOUS

## 2021-08-15 SURGICAL SUPPLY — 69 items
APL SKNCLS STERI-STRIP NONHPOA (GAUZE/BANDAGES/DRESSINGS) ×1
BAG COUNTER SPONGE SURGICOUNT (BAG) ×2 IMPLANT
BAG SPNG CNTER NS LX DISP (BAG) ×1
BAG SURGICOUNT SPONGE COUNTING (BAG) ×1
BAND INSRT 18 STRL LF DISP RB (MISCELLANEOUS)
BAND RUBBER #18 3X1/16 STRL (MISCELLANEOUS) IMPLANT
BENZOIN TINCTURE PRP APPL 2/3 (GAUZE/BANDAGES/DRESSINGS) ×4 IMPLANT
BIT DRILL NEURO 2X3.1 SFT TUCH (MISCELLANEOUS) ×1 IMPLANT
BLADE SURG 15 STRL LF DISP TIS (BLADE) ×1 IMPLANT
BLADE SURG 15 STRL SS (BLADE) ×3
BLADE ULTRA TIP 2M (BLADE) ×3 IMPLANT
BUR BARREL STRAIGHT FLUTE 4.0 (BURR) ×3 IMPLANT
BUR MATCHSTICK NEURO 3.0 LAGG (BURR) ×3 IMPLANT
CANISTER SUCT 3000ML PPV (MISCELLANEOUS) ×3 IMPLANT
CARTRIDGE OIL MAESTRO DRILL (MISCELLANEOUS) ×1 IMPLANT
CLOSURE STERI-STRIP 1/2X4 (GAUZE/BANDAGES/DRESSINGS) ×1
CLOSURE WOUND 1/2 X4 (GAUZE/BANDAGES/DRESSINGS) ×1
CLSR STERI-STRIP ANTIMIC 1/2X4 (GAUZE/BANDAGES/DRESSINGS) ×1 IMPLANT
COVER MAYO STAND STRL (DRAPES) ×3 IMPLANT
DIFFUSER DRILL AIR PNEUMATIC (MISCELLANEOUS) ×3 IMPLANT
DRAIN JACKSON PRT FLT 7MM (DRAIN) ×2 IMPLANT
DRAPE LAPAROTOMY 100X72 PEDS (DRAPES) ×3 IMPLANT
DRAPE MICROSCOPE LEICA (MISCELLANEOUS) IMPLANT
DRAPE SURG 17X23 STRL (DRAPES) ×6 IMPLANT
DRILL NEURO 2X3.1 SOFT TOUCH (MISCELLANEOUS) ×3
DRSG OPSITE POSTOP 3X4 (GAUZE/BANDAGES/DRESSINGS) ×3 IMPLANT
DRSG OPSITE POSTOP 4X6 (GAUZE/BANDAGES/DRESSINGS) ×2 IMPLANT
ELECT REM PT RETURN 9FT ADLT (ELECTROSURGICAL) ×3
ELECTRODE REM PT RTRN 9FT ADLT (ELECTROSURGICAL) ×1 IMPLANT
EVACUATOR SILICONE 100CC (DRAIN) ×2 IMPLANT
GAUZE 4X4 16PLY ~~LOC~~+RFID DBL (SPONGE) IMPLANT
GLOVE EXAM NITRILE XL STR (GLOVE) IMPLANT
GLOVE SURG ENC MOIS LTX SZ8 (GLOVE) ×3 IMPLANT
GLOVE SURG ENC MOIS LTX SZ8.5 (GLOVE) ×3 IMPLANT
GLOVE SURG LTX SZ6.5 (GLOVE) ×4 IMPLANT
GLOVE SURG LTX SZ7 (GLOVE) ×2 IMPLANT
GLOVE SURG POLYISO LF SZ7 (GLOVE) ×4 IMPLANT
GLOVE SURG UNDER POLY LF SZ6.5 (GLOVE) ×4 IMPLANT
GLOVE SURG UNDER POLY LF SZ7 (GLOVE) ×2 IMPLANT
GLOVE SURG UNDER POLY LF SZ7.5 (GLOVE) ×2 IMPLANT
GOWN STRL REUS W/ TWL LRG LVL3 (GOWN DISPOSABLE) IMPLANT
GOWN STRL REUS W/ TWL XL LVL3 (GOWN DISPOSABLE) ×1 IMPLANT
GOWN STRL REUS W/TWL LRG LVL3 (GOWN DISPOSABLE) ×9
GOWN STRL REUS W/TWL XL LVL3 (GOWN DISPOSABLE) ×3
HEMOSTAT POWDER KIT SURGIFOAM (HEMOSTASIS) ×3 IMPLANT
KIT BASIN OR (CUSTOM PROCEDURE TRAY) ×3 IMPLANT
KIT TURNOVER KIT B (KITS) ×3 IMPLANT
MARKER SKIN DUAL TIP RULER LAB (MISCELLANEOUS) ×3 IMPLANT
NDL SPNL 18GX3.5 QUINCKE PK (NEEDLE) ×1 IMPLANT
NEEDLE HYPO 22GX1.5 SAFETY (NEEDLE) ×3 IMPLANT
NEEDLE SPNL 18GX3.5 QUINCKE PK (NEEDLE) ×3 IMPLANT
NS IRRIG 1000ML POUR BTL (IV SOLUTION) ×3 IMPLANT
OIL CARTRIDGE MAESTRO DRILL (MISCELLANEOUS) ×3
PACK LAMINECTOMY NEURO (CUSTOM PROCEDURE TRAY) ×3 IMPLANT
PATTIES SURGICAL 1X1 (DISPOSABLE) ×2 IMPLANT
PEEK S VISTA 7X11X14 (Peek) ×6 IMPLANT
PIN DISTRACTION 14MM (PIN) ×6 IMPLANT
PLATE ANT CERV XTEND 3 LV 48 (Plate) ×2 IMPLANT
PUTTY DBM 5CC CALC GRAN (Putty) ×2 IMPLANT
SCREW VAR 4.2 XD SELF DRILL 12 (Screw) ×16 IMPLANT
SPONGE INTESTINAL PEANUT (DISPOSABLE) ×4 IMPLANT
SPONGE SURGIFOAM ABS GEL 100 (HEMOSTASIS) ×2 IMPLANT
STRIP CLOSURE SKIN 1/2X4 (GAUZE/BANDAGES/DRESSINGS) ×2 IMPLANT
SUT VIC AB 0 CT1 27 (SUTURE) ×3
SUT VIC AB 0 CT1 27XBRD ANTBC (SUTURE) ×1 IMPLANT
SUT VIC AB 3-0 SH 8-18 (SUTURE) ×5 IMPLANT
TOWEL GREEN STERILE (TOWEL DISPOSABLE) ×3 IMPLANT
TOWEL GREEN STERILE FF (TOWEL DISPOSABLE) ×3 IMPLANT
WATER STERILE IRR 1000ML POUR (IV SOLUTION) ×3 IMPLANT

## 2021-08-15 NOTE — Op Note (Signed)
Brief history: The patient is a 76 year old white female who is complained of neck and left arm pain consistent with a cervical radiculopathy.  She failed medical management and was worked up with a cervical MRI which demonstrated multilevel spondylosis and neuroforaminal stenosis most prominent at C3-4, C4-5 and C5-6.  I discussed the various treatment options with her.  She has decided proceed with surgery.  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 Earle Gell  Asst.: Dr. Deatra Ina and Arnetha Massy, NP  Anesthesia: Gen. endotracheal  Estimated blood loss: 125 cc  Drains: Jackson-Pratt drain in the prevertebral space.  Complications: None  Description of procedure: The patient was brought to the operating room by the anesthesia team. General endotracheal anesthesia was induced. A roll was placed under the patient's shoulders to keep the neck in the neutral position. The patient's anterior cervical region was then prepared with Betadine scrub and Betadine solution. Sterile drapes were applied.  The area to be incised was then injected with Marcaine with epinephrine solution. I then used a scalpel to make a transverse incision in the patient's left anterior neck. I used the Metzenbaum scissors to divide the platysmal muscle and then to dissect medial to the sternocleidomastoid muscle, jugular vein, and carotid artery. I carefully dissected down towards the anterior cervical spine identifying the esophagus and retracting it  medially. Then using Kitner swabs to clear soft tissue from the anterior cervical spine. We then inserted a bent spinal needle into the upper exposed intervertebral disc space. We then obtained intraoperative radiographs confirm our location.  I then used electrocautery to detach the medial border of the longus colli muscle bilaterally from the C3-4, C4-5 and C5-6 intervertebral disc spaces. I then inserted the Caspar self-retaining retractor underneath the longus colli muscle bilaterally to provide exposure.  We then incised the intervertebral disc at C5-6. We then performed a partial intervertebral discectomy with a pituitary forceps and the Karlin curettes. I then inserted distraction screws into the vertebral bodies at C5-6. We then distracted the interspace. We then used the high-speed drill to decorticate the vertebral endplates at D1-7, to drill away the remainder of the intervertebral disc, to drill away some posterior spondylosis, and to thin out the posterior longitudinal ligament. I then incised ligament with the arachnoid knife. We then removed the ligament with a Kerrison punches undercutting the vertebral endplates and decompressing the thecal sac. We then performed foraminotomies about the bilateral C6 nerve roots. This completed the decompression at this level.  We then repeated this procedure in analogous fashion at C4-5 and C3-4 decompressing the thecal sac and the bilateral C5 and C4 nerve roots.  We now turned our to attention to the interbody fusion. We used the trial spacers to determine the appropriate size for the interbody prosthesis. We then pre-filled prosthesis with a combination of local morcellized autograft bone that we obtained during decompression as well as Zimmer DBM. We then inserted the prosthesis into the distracted interspace at C3-4, C4-5 and C5-6. We then removed the distraction screws. There was a good snug fit of the prosthesis in the interspace.  Having completed  the fusion we now turned attention  to the anterior spinal instrumentation. We used the high-speed drill to drill away some anterior spondylosis at the disc spaces so that the plate lay down flat. We selected the appropriate length titanium anterior cervical plate. We laid it along the anterior aspect of the vertebral bodies from C3-C6. We then drilled 12 mm holes at C3, C4, C5 and C6. We then secured the plate to the vertebral bodies by placing two 12 mm self-tapping screws at C3, C4, C5, and C6. We then obtained intraoperative radiograph. The demonstrating good position of the instrumentation. We therefore secured the screws the plate the locking each cam. This completed the instrumentation.  We then obtained hemostasis using bipolar electrocautery. We irrigated the wound out with bacitracin solution. We then removed the retractor. We inspected the esophagus for any damage. There was none apparent.  We placed a 7 mm flat Jackson-Pratt drain in the prevertebral space and tunneled it out through a separate stab wound.  We then reapproximated patient's platysmal muscle with interrupted 3-0 Vicryl suture. We then reapproximated the subcutaneous tissue with interrupted 3-0 Vicryl suture. The skin was reapproximated with Steri-Strips and benzoin. The wound was then covered with bacitracin ointment. A sterile dressing was applied. The drapes were removed. Patient was subsequently extubated by the anesthesia team and transported to the post anesthesia care unit in stable condition. All sponge instrument and needle counts were reportedly correct at the end of this case.

## 2021-08-15 NOTE — H&P (Signed)
Subjective: The patient is a 76 year old white female who has complained of neck and left shoulder/arm pain consistent with a cervical radiculopathy.  She has failed medical management and was worked up with a cervical MRI which demonstrated spondylosis and foraminal stenosis most prominent at C3-4, C4-5 and C5-6.  I discussed the various treatment options with her.  She has decided proceed with surgery.  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   Thyroid disease    goiter-no meds   Thyroid nodule     Past Surgical History:  Procedure Laterality Date   ABDOMINAL HYSTERECTOMY  03/2010   BREAST DUCTAL SYSTEM EXCISION  01/17/2012   Procedure: EXCISION DUCTAL SYSTEM BREAST;  Surgeon: Rolm Bookbinder, MD;  Location: [facility];  Service: General;  Laterality: Right;  Right breast duct excision   COLONOSCOPY     KNEE SURGERY  1967   right   THYROIDECTOMY, PARTIAL  06/28/2012   Procedure: THYROIDECTOMY SUBTOTAL;  Surgeon: Pedro Earls, MD;  Location: [facility];  Service: General;  Laterality: Right;    Not on File  Social History   Tobacco Use   Smoking status: Former    Types: Cigarettes    Quit date: 07/25/1991    Years since quitting: 30.0   Smokeless tobacco: Never  Substance Use Topics   Alcohol use: No    Family History  Problem Relation Age of Onset   Stroke Mother    Hypertension Father    Prior to Admission medications   Medication Sig Start Date End Date Taking? Authorizing Provider  Apoaequorin (PREVAGEN) 10 MG CAPS Take 10 mg by mouth  daily.   Yes [provider]  BLACK ELDERBERRY PO Take 1 each by mouth daily.   Yes [provider]  hydrochlorothiazide (HYDRODIURIL) 25 MG tablet Take 25 mg by mouth daily. 05/17/20  Yes [provider]  latanoprost (XALATAN) 0.005 % ophthalmic solution 1 drop at bedtime.   Yes [provider]  losartan (COZAAR) 25 MG tablet Take 25 mg by mouth daily. 05/17/20  Yes [provider]  Propylene Glycol (SYSTANE BALANCE) 0.6 % SOLN Place 1 drop into both eyes daily as needed (dry eyes).   Yes [provider]  timolol (TIMOPTIC) 0.5 % ophthalmic solution Place 1 drop into both eyes daily.   Yes [provider]     Review of Systems  Positive ROS: As above  All other systems have been reviewed and were otherwise negative with the exception of those mentioned in the HPI and as above.  Objective: Vital signs in last 24 hours: Temp:  [98.1 F (36.7 C)] 98.1 F (36.7 C) (01/23 1012) Pulse Rate:  [72] 72 (01/23 1012) Resp:  [17] 17 (01/23 1012) BP: (141)/(64) 141/64 (01/23 1012) SpO2:  [99 %] 99 % (01/23 1012) Weight:  [77.1 kg] 77.1 kg (01/23 1012) Estimated body mass index is 28.29 kg/m as calculated from the following:   Height as of this encounter: 5'  5" (1.651 m).   Weight as of this encounter: 77.1 kg.   General Appearance: Alert Head: Normocephalic, without obvious abnormality, atraumatic Eyes: PERRL, conjunctiva/corneas clear, EOM's intact,    Ears: Normal  Throat: Normal  Neck: Limited cervical range of motion.  Spurling's testing is positive on the left. Back: unremarkable Lungs: Clear to auscultation bilaterally, respirations unlabored Heart: Regular rate and rhythm, no murmur, rub or gallop Abdomen: Soft, non-tender Extremities: Extremities normal, atraumatic, no cyanosis or edema Skin: unremarkable  NEUROLOGIC:   Mental status: alert and oriented,Motor Exam - grossly normal Sensory Exam - grossly  normal Reflexes:  Coordination - grossly normal Gait - grossly normal Balance - grossly normal Cranial Nerves: I: smell Not tested  II: visual acuity  OS: Normal  OD: Normal   II: visual fields Full to confrontation  II: pupils Equal, round, reactive to light  III,VII: ptosis None  III,IV,VI: extraocular muscles  Full ROM  V: mastication Normal  V: facial light touch sensation  Normal  V,VII: corneal reflex  Present  VII: facial muscle function - upper  Normal  VII: facial muscle function - lower Normal  VIII: hearing Not tested  IX: soft palate elevation  Normal  IX,X: gag reflex Present  XI: trapezius strength  5/5  XI: sternocleidomastoid strength 5/5  XI: neck flexion strength  5/5  XII: tongue strength  Normal    Data Review Lab Results  Component Value Date   WBC 6.9 07/19/2021   HGB 13.0 07/19/2021   HCT 39.9 07/19/2021   MCV 91.7 07/19/2021   PLT 328 07/19/2021   Lab Results  Component Value Date   NA 134 (L) 07/19/2021   K 3.6 07/19/2021   CL 100 07/19/2021   CO2 24 07/19/2021   BUN 10 07/19/2021   CREATININE 0.89 07/19/2021   GLUCOSE 114 (H) 07/19/2021   No results found for: INR, PROTIME  Assessment/Plan: Cervical spondylosis, cervical radiculopathy, cervicalgia: I have discussed the situation with the patient.  I reviewed her imaging studies with her and pointed out the abnormalities.  We have discussed the various treatment options including surgery.  I described the surgical treatment option of a C3-4, C4 4 5 and C5-6 anterior cervicectomy, fusion and plating.  I have shown her surgical models.  I have given her a surgical pamphlet.  We have discussed the risk, benefits, alternatives, expected postoperative course, and likelihood of achieving our goals with surgery.  I have answered all her questions.  She has decided to proceed with surgery.   Susan Ali 08/15/2021 1:03 PM

## 2021-08-15 NOTE — Progress Notes (Signed)
Orthopedic Tech Progress Note Patient Details:  Susan Ali 03/08/1946 [id number]  PACU RN called requesting an [place]  Patient ID: Susan Ali, female   DOB: 26-Sep-1945, 76 y.o.   MRN: [id number]  Janit Pagan 08/15/2021, 5:40 PM

## 2021-08-15 NOTE — Anesthesia Preprocedure Evaluation (Addendum)
Anesthesia Evaluation  Patient identified by MRN, date of birth, ID band Patient awake    Airway Mallampati: II  TM Distance: >3 FB Neck ROM: Limited    Dental  (+) Dental Advisory Given, Caps,    Pulmonary former smoker,    breath sounds clear to auscultation       Cardiovascular hypertension, Pt. on medications + dysrhythmias  Rhythm:Regular Rate:Normal     Neuro/Psych  Neuromuscular disease    GI/Hepatic Neg liver ROS, GERD  ,  Endo/Other  negative endocrine ROS  Renal/GU negative Renal ROS     Musculoskeletal  (+) Arthritis ,   Abdominal   Peds  Hematology   Anesthesia Other Findings   Reproductive/Obstetrics                            Anesthesia Physical Anesthesia Plan  ASA: 3  Anesthesia Plan: General   Post-op Pain Management:    Induction: Intravenous  PONV Risk Score and Plan: Ondansetron, Dexamethasone and Midazolam  Airway Management Planned: Oral ETT  Additional Equipment:   Intra-op Plan:   Post-operative Plan: Possible Post-op intubation/ventilation  Informed Consent: I have reviewed the patients History and Physical, chart, labs and discussed the procedure including the risks, benefits and alternatives for the proposed anesthesia with the patient or authorized representative who has indicated his/her understanding and acceptance.     Dental advisory given  Plan Discussed with: CRNA and Anesthesiologist  Anesthesia Plan Comments:         Anesthesia Quick Evaluation

## 2021-08-15 NOTE — Anesthesia Procedure Notes (Signed)
Procedure Name: Intubation Date/Time: 08/15/2021 2:06 PM Performed by: Jenne Campus, CRNA Pre-anesthesia Checklist: Patient identified, Emergency Drugs available, Suction available and Patient being monitored Patient Re-evaluated:Patient Re-evaluated prior to induction Oxygen Delivery Method: Circle System Utilized Preoxygenation: Pre-oxygenation with 100% oxygen Induction Type: IV induction Ventilation: Mask ventilation without difficulty Laryngoscope Size: Glidescope and 3 Grade View: Grade I Tube type: Oral Tube size: 7.0 mm Number of attempts: 1 Airway Equipment and Method: Stylet Placement Confirmation: ETT inserted through vocal cords under direct vision, positive ETCO2 and breath sounds checked- equal and bilateral Secured at: 21 cm Tube secured with: Tape Dental Injury: Teeth and Oropharynx as per pre-operative assessment

## 2021-08-15 NOTE — Transfer of Care (Signed)
Immediate Anesthesia Transfer of Care Note  Patient: Susan Ali  Procedure(s) Performed: Anterior Cervical Decompression Discectomy Fusion, Interbody Prosthesis, Plate and Screws, Cervical three-four, Cervical four-five, Cervical five six (Neck)  Patient Location: PACU  Anesthesia Type:General  Level of Consciousness: drowsy  Airway & Oxygen Therapy: Patient Spontanous Breathing and Patient connected to face mask oxygen  Post-op Assessment: Report given to RN and Post -op Vital signs reviewed and stable  Post vital signs: Reviewed and stable  Last Vitals:  Vitals Value Taken Time  BP 93/65 08/15/21 1713  Temp    Pulse 84 08/15/21 1714  Resp 19 08/15/21 1714  SpO2 93 % 08/15/21 1714  Vitals shown include unvalidated device data.  Last Pain:  Vitals:   08/15/21 1036  TempSrc:   PainSc: 4          Complications: No notable events documented.

## 2021-08-15 NOTE — Progress Notes (Signed)
Subjective: The patient is alert and pleasant.  She is in no apparent distress.  Objective: Vital signs in last 24 hours: Temp:  [97.7 F (36.5 C)-98.1 F (36.7 C)] 97.7 F (36.5 C) (01/23 1715) Pulse Rate:  [72-79] 79 (01/23 1715) Resp:  [13-17] 13 (01/23 1715) BP: (93-141)/(64-65) 93/65 (01/23 1715) SpO2:  [93 %-99 %] 93 % (01/23 1715) Weight:  [77.1 kg] 77.1 kg (01/23 1012) Estimated body mass index is 28.29 kg/m as calculated from the following:   Height as of this encounter: 5\' 5"  (1.651 m).   Weight as of this encounter: 77.1 kg.   Intake/Output from previous day: No intake/output data recorded. Intake/Output this shift: Total I/O In: 1600 [I.V.:1500; IV Piggyback:100] Out: 300 [Urine:200; Blood:100]  Physical exam the patient is alert and pleasant.  She is moving all 4 extremities well.  Her dressing is clean and dry.  There is no hematoma or shift.  Lab Results: No results for input(s): WBC, HGB, HCT, PLT in the last 72 hours. BMET No results for input(s): NA, K, CL, CO2, GLUCOSE, BUN, CREATININE, CALCIUM in the last 72 hours.  Studies/Results: DG Cervical Spine 2 or 3 views  Result Date: 08/15/2021 CLINICAL DATA:  C3-C6 ACDF localization films. EXAM: CERVICAL SPINE - 2-3 VIEW COMPARISON:  None. FINDINGS: Two portable cross-table lateral views of the cervical spine obtained in the operating room. Image time stamped 1438 hour demonstrates surgical instrument at the anterior aspect of the C4-C5 disc space. Image time stamped at 1640 hour demonstrates anterior fusion from C3 through C5 with interbody spacers. IMPRESSION: Cross-table lateral views of the cervical spine obtained during anterior C3 through C6 anterior fusion. Electronically Signed   By: Keith Rake M.D.   On: 08/15/2021 17:02    Assessment/Plan: The patient is doing well.  LOS: 0 days     Ophelia Charter 08/15/2021, 5:58 PM

## 2021-08-16 ENCOUNTER — Encounter (HOSPITAL_COMMUNITY): Payer: Self-pay | Admitting: Neurosurgery

## 2021-08-16 MED ORDER — HYDROCODONE-ACETAMINOPHEN 5-325 MG PO TABS
1.0000 | ORAL_TABLET | ORAL | Status: DC | PRN
  Administered 2021-08-16: 05:00:00 2 via ORAL
  Filled 2021-08-16: qty 2

## 2021-08-16 MED ORDER — DOCUSATE SODIUM 100 MG PO CAPS: 100.0000 mg | ORAL_CAPSULE | Freq: Two times a day (BID) | ORAL | 0 refills | Status: DC

## 2021-08-16 MED ORDER — HYDROCODONE-ACETAMINOPHEN 5-325 MG PO TABS: 1.0000 | ORAL_TABLET | ORAL | 0 refills | Status: DC | PRN

## 2021-08-16 NOTE — Evaluation (Signed)
Physical Therapy Evaluation Patient Details Name: Susan Ali MRN: [id number] DOB: [birth year] Today's Date: 08/16/2021  History of Present Illness  Pt is a 76 y/o Female who presents s/p C3-C6 ACDF on 08/15/21. PMH includes arthritis, cancer, HTN, and thyroid disease.   Clinical Impression  Pt admitted with above diagnosis. At the time of PT eval, pt was able to demonstrate transfers and ambulation with up to min assist for balance support and safety with ambulation. Pt was educated on precautions, brace application/wearing schedule, appropriate activity progression, and car transfer. Pt currently with functional limitations due to the deficits listed below (see PT Problem List). Pt will benefit from skilled PT to increase their independence and safety with mobility to allow discharge to the venue listed below.       Recommendations for follow up therapy are one component of a multi-disciplinary discharge planning process, led by the attending physician.  Recommendations may be updated based on patient status, additional functional criteria and insurance authorization.  Follow Up Recommendations No PT follow up    Assistance Recommended at Discharge Intermittent Supervision/Assistance  Patient can return home with the following  A little help with walking and/or transfers;A little help with bathing/dressing/bathroom;Assist for transportation;Help with stairs or ramp for entrance    Equipment Recommendations None recommended by PT  Recommendations for Other Services       Functional Status Assessment Patient has had a recent decline in their functional status and demonstrates the ability to make significant improvements in function in a reasonable and predictable amount of time.     Precautions / Restrictions Precautions Precautions: Cervical;Fall Precaution Booklet Issued: Yes (comment) Precaution Comments: Reviewed handout and pt was cued for precautions during functional  mobility. Required Braces or Orthoses: Cervical Brace Cervical Brace: Hard collar;At all times Restrictions Weight Bearing Restrictions: No      Mobility  Bed Mobility Overal bed mobility: Needs Assistance Bed Mobility: Rolling, Sidelying to Sit, Sit to Sidelying Rolling: Supervision Sidelying to sit: Supervision     Sit to sidelying: Supervision General bed mobility comments: Light supervision for maintenance of precautions and optimal log roll technique. HOB slightly elevated and rails lowered to simulate home environment.    Transfers Overall transfer level: Needs assistance Equipment used: None Transfers: Sit to/from Stand Sit to Stand: Min guard           General transfer comment: Close guard for safety as pt powered up to full stand. No unsteadiness or LOB noted.    Ambulation/Gait Ambulation/Gait assistance: Min guard, Min assist Gait Distance (Feet): 350 Feet Assistive device: 1 person hand held assist Gait Pattern/deviations: Step-through pattern, Decreased stride length, Trunk flexed, Narrow base of support, Drifts right/left Gait velocity: Decreased Gait velocity interpretation: 1.31 - 2.62 ft/sec, indicative of limited community ambulator   General Gait Details: HHA provided for unsteadiness and occasional LOB. Light min assist provided to recover. Pt reports she has been furniture walking at home PTA.  Stairs Stairs: Yes Stairs assistance: Min guard Stair Management: One rail Right, Step to pattern, Forwards Number of Stairs: 4 General stair comments: VC's for sequencing and general safety. Hands on guarding for safety however no assist required.  Wheelchair Mobility    Modified Rankin (Stroke Patients Only)       Balance Overall balance assessment: Mild deficits observed, not formally tested  Pertinent Vitals/Pain Pain Assessment Pain Assessment: Faces Faces Pain Scale: Hurts  little more Pain Location: neck Pain Descriptors / Indicators: Discomfort, Operative site guarding Pain Intervention(s): Limited activity within patient's tolerance, Monitored during session, Repositioned    Home Living Family/patient expects to be discharged to:: Private residence Living Arrangements: Non-relatives/Friends Available Help at Discharge: Friend(s);Available 24 hours/day Type of Home: House Home Access: Stairs to enter   [employer] of Steps: 4   Home Layout: Two level;Able to live on main level with bedroom/bathroom Home Equipment: Shower seat Additional Comments: plans to use walk-in shower, lives on main level of home    Prior Function Prior Level of Function : Independent/Modified Independent                     Hand Dominance        Extremity/Trunk Assessment   Upper Extremity Assessment Upper Extremity Assessment: Defer to OT evaluation    Lower Extremity Assessment Lower Extremity Assessment: Defer to PT evaluation    Cervical / Trunk Assessment Cervical / Trunk Assessment: Neck Surgery  Communication   Communication: No difficulties  Cognition Arousal/Alertness: Awake/alert Behavior During Therapy: WFL for tasks assessed/performed Overall Cognitive Status: Within Functional Limits for tasks assessed                                          General Comments      Exercises     Assessment/Plan    PT Assessment Patient needs continued PT services  PT Problem List Decreased strength;Decreased activity tolerance;Decreased balance;Decreased mobility;Decreased knowledge of use of DME;Decreased safety awareness;Decreased knowledge of precautions;Pain       PT Treatment Interventions DME instruction;Gait training;Stair training;Functional mobility training;Therapeutic activities;Therapeutic exercise;Neuromuscular re-education;Patient/family education    PT Goals (Current goals can be found in the Care Plan  section)  Acute Rehab PT Goals PT Goal Formulation: With patient Time For Goal Achievement: 08/23/21 Potential to Achieve Goals: Good    Frequency Min 5X/week     Co-evaluation               AM-PAC PT "6 Clicks" Mobility  Outcome Measure Help needed turning from your back to your side while in a flat bed without using bedrails?: A Little Help needed moving from lying on your back to sitting on the side of a flat bed without using bedrails?: A Little Help needed moving to and from a bed to a chair (including a wheelchair)?: A Little Help needed standing up from a chair using your arms (e.g., wheelchair or bedside chair)?: A Little Help needed to walk in hospital room?: A Little Help needed climbing 3-5 steps with a railing? : A Little 6 Click Score: 18    End of Session Equipment Utilized During Treatment: Gait belt;Cervical collar Activity Tolerance: Patient tolerated treatment well Patient left: in bed;with call bell/phone within reach;with family/visitor present Nurse Communication: Mobility status PT Visit Diagnosis: Unsteadiness on feet (R26.81);Pain Pain - part of body:  (neck/shoulders)    Time: 1002-1020 PT Time Calculation (min) (ACUTE ONLY): 18 min   Charges:   PT Evaluation $PT Eval Low Complexity: 1 Low          Rolinda Roan, PT, DPT Acute Rehabilitation Services Pager: [phone] Office: [phone]   Thelma Comp 08/16/2021, 11:57 AM

## 2021-08-16 NOTE — Discharge Instructions (Signed)

## 2021-08-16 NOTE — Plan of Care (Signed)
Pt doing well. Pt given D/C instructions with verbal understanding, Rx's were sent to the pharmacy by MD. Pt's incision is clean and dry with no sign of infection. Pt's incision is clean and dry with no sign of infection. Pt's IV and drain were removed prior to D/C. Pt D/C'd home via wheelchair per MD order. Pt is stable @ D/C and has no other needs at this time. Holli Humbles, RN

## 2021-08-16 NOTE — TOC Transition Note (Signed)
Transition of Care [facility]) - CM/SW Discharge Note   Patient Details  Name: Susan Ali MRN: [id number] Date of Birth: 09/07/1945  [facility] CM/SW Contact:  Carles Collet, RN Phone Number: 08/16/2021, 9:19 AM   Clinical Narrative:   No HH needs identified. Unit staff to provide needed DME. No other TOC needs identified.     Final next level of care: Home/Self Care     Patient Goals and CMS Choice        Discharge Placement                       Discharge Plan and Services                                     Social Determinants of Health (SDOH) Interventions     Readmission Risk Interventions No flowsheet data found.

## 2021-08-16 NOTE — Discharge Summary (Signed)
Physician Discharge Summary  Patient ID: Susan Ali MRN: [id number] DOB/AGE: 29-Nov-1945 76 y.o.  Admit date: 08/15/2021 Discharge date: 08/16/2021  Admission Diagnoses: Cervical spondylosis, cervical radiculopathy, cervicalgia  Discharge Diagnoses: The same Principal Problem:   Cervical spondylosis with radiculopathy   Discharged Condition: good  Hospital Course: I performed a C3-4, C4-5 and C5-6 anterior cervicectomy, fusion and plating on the patient on 08/15/2021.  The surgery went well.  The patient's postoperative course was unremarkable.  On postoperative day #1 she requested discharge home.  She was given written and verbal discharge instructions.  All her questions were answered.  Consults: PT, OT, care management Significant Diagnostic Studies: None Treatments: C3-4, C4-5 and C5-6 anterior cervical discectomy, fusion and plating. Discharge Exam: Blood pressure 134/67, pulse 82, temperature 97.8 F (36.6 C), temperature source Oral, resp. rate 18, height 5\' 5"  (1.651 m), weight 77.1 kg, SpO2 98 %. The patient is alert and pleasant.  She looks well.  Her dressing has a small bloodstain.  There is no hematoma or shift.  Her strength is normal.  Disposition: Home  Discharge Instructions     Call MD for:  difficulty breathing, headache or visual disturbances   Complete by: As directed    Call MD for:  extreme fatigue   Complete by: As directed    Call MD for:  hives   Complete by: As directed    Call MD for:  persistant dizziness or light-headedness   Complete by: As directed    Call MD for:  persistant nausea and vomiting   Complete by: As directed    Call MD for:  redness, tenderness, or signs of infection (pain, swelling, redness, odor or green/yellow discharge around incision site)   Complete by: As directed    Call MD for:  severe uncontrolled pain   Complete by: As directed    Call MD for:  temperature >100.4   Complete by: As directed    Diet - low  sodium heart healthy   Complete by: As directed    Discharge instructions   Complete by: As directed    Call [phone] for a followup appointment. Take a stool softener while you are using pain medications.   Driving Restrictions   Complete by: As directed    Do not drive for 2 weeks.   Increase activity slowly   Complete by: As directed    Lifting restrictions   Complete by: As directed    Do not lift more than 5 pounds. No excessive bending or twisting.   May shower / Bathe   Complete by: As directed    Remove the dressing for 3 days after surgery.  You may shower, but leave the incision alone.   Remove dressing in 48 hours   Complete by: As directed       Allergies as of 08/16/2021   No Known Allergies      Medication List     TAKE these medications    BLACK ELDERBERRY PO Take 1 each by mouth daily.   docusate sodium 100 MG capsule Commonly known as: COLACE Take 1 capsule (100 mg total) by mouth 2 (two) times daily.   hydrochlorothiazide 25 MG tablet Commonly known as: HYDRODIURIL Take 25 mg by mouth daily.   HYDROcodone-acetaminophen 5-325 MG tablet Commonly known as: NORCO/VICODIN Take 1-2 tablets by mouth every 4 (four) hours as needed for moderate pain or severe pain.   latanoprost 0.005 % ophthalmic solution Commonly known as: XALATAN 1 drop at  bedtime.   losartan 25 MG tablet Commonly known as: COZAAR Take 25 mg by mouth daily.   Prevagen 10 MG Caps Generic drug: Apoaequorin Take 10 mg by mouth daily.   Systane Balance 0.6 % Soln Generic drug: Propylene Glycol Place 1 drop into both eyes daily as needed (dry eyes).   timolol 0.5 % ophthalmic solution Commonly known as: TIMOPTIC Place 1 drop into both eyes daily.         Signed: Ophelia Charter 08/16/2021, 7:45 AM

## 2021-08-16 NOTE — Evaluation (Signed)
Occupational Therapy Evaluation Patient Details Name: Susan Ali MRN: [id number] DOB: 04-12-1946 Today's Date: 08/16/2021   History of Present Illness Pt is a 76 y/o F s/p C3-4, C4-5, C5-6 ACDF. PMH includes arthritis, cancer, HTN, and thyroid disease.   Clinical Impression   Pt reports independence with ADLs and functional mobility at baseline, lives with roommate who is abel to provide assistance 24/7 at d/c. Pt currently supervision - min A for ADLs, educated pt on compensatory strategies for dressing and bathing, able to use figure 4 technique for LB dressing. Pt supervision for bed mobility and min guard for transfers. Educated pt on cervical precautions and log rolling technique, pt verbalized understanding, able to adhere to precautions throughout session. Pt presenting with impairments listed below, however has no acute OT needs at this time. Will s/o. Recommend d/c home with assistance.     Recommendations for follow up therapy are one component of a multi-disciplinary discharge planning process, led by the attending physician.  Recommendations may be updated based on patient status, additional functional criteria and insurance authorization.   Follow Up Recommendations  No OT follow up    Assistance Recommended at Discharge Set up Supervision/Assistance  Patient can return home with the following A little help with walking and/or transfers;A little help with bathing/dressing/bathroom;Assist for transportation;Assistance with cooking/housework    Functional Status Assessment  Patient has had a recent decline in their functional status and demonstrates the ability to make significant improvements in function in a reasonable and predictable amount of time.  Equipment Recommendations  None recommended by OT;Other (comment) (pt has all necessary DME)    Recommendations for Other Services PT consult     Precautions / Restrictions Precautions Precautions: Cervical Precaution  Booklet Issued: Yes (comment) Required Braces or Orthoses: Cervical Brace Cervical Brace: Hard collar;At all times Restrictions Weight Bearing Restrictions: No      Mobility Bed Mobility Overal bed mobility: Needs Assistance Bed Mobility: Supine to Sit     Supine to sit: Supervision     General bed mobility comments: pt long sitting in bed upon arrival, able to verbalize steps for log rolling technique, provided picture handout for steps as well    Transfers Overall transfer level: Needs assistance Equipment used: None Transfers: Sit to/from Stand Sit to Stand: Min guard                  Balance Overall balance assessment: Mild deficits observed, not formally tested                                         ADL either performed or assessed with clinical judgement   ADL Overall ADL's : Needs assistance/impaired Eating/Feeding: Set up;Sitting   Grooming: Oral care;Wash/dry hands;Standing;Cueing for compensatory techniques Grooming Details (indicate cue type and reason): spits into cup to adhere to cervical precautions Upper Body Bathing: Minimal assistance;Sitting   Lower Body Bathing: Minimal assistance;Sitting/lateral leans   Upper Body Dressing : Supervision/safety;Sitting;Cueing for compensatory techniques Upper Body Dressing Details (indicate cue type and reason): dons button up shirt sitting EOB Lower Body Dressing: Supervision/safety;Cueing for sequencing;Cueing for compensatory techniques Lower Body Dressing Details (indicate cue type and reason): able to bring knee toward chest to don undergarments/pants Toilet Transfer: Supervision/safety;Ambulation;Regular Glass blower/designer Details (indicate cue type and reason): increased time to stand/sit from commode Toileting- Clothing Manipulation and Hygiene: Supervision/safety;Sitting/lateral lean  Vision   Vision Assessment?: No apparent visual deficits     Perception      Praxis      Pertinent Vitals/Pain Pain Assessment Pain Assessment: Faces Pain Score: 3  Faces Pain Scale: Hurts a little bit Pain Location: neck Pain Descriptors / Indicators: Discomfort Pain Intervention(s): Limited activity within patient's tolerance, Monitored during session, Repositioned     Hand Dominance     Extremity/Trunk Assessment Upper Extremity Assessment Upper Extremity Assessment: Overall WFL for tasks assessed   Lower Extremity Assessment Lower Extremity Assessment: Defer to PT evaluation   Cervical / Trunk Assessment Cervical / Trunk Assessment: Neck Surgery   Communication Communication Communication: No difficulties   Cognition Arousal/Alertness: Awake/alert Behavior During Therapy: WFL for tasks assessed/performed Overall Cognitive Status: Within Functional Limits for tasks assessed                                       General Comments       Exercises     Shoulder Instructions      Home Living Family/patient expects to be discharged to:: Private residence Living Arrangements: Non-relatives/Friends Available Help at Discharge: Friend(s);Available 24 hours/day Type of Home: House Home Access: Stairs to enter [employer] of Steps: 4   Home Layout: Two level;Able to live on main level with bedroom/bathroom     Bathroom Shower/Tub: Walk-in shower;Tub/shower unit   [employer]: Standard     Home Equipment: Building services engineer Comments: plans to use walk-in shower, lives on main level of home      Prior Functioning/Environment Prior Level of Function : Independent/Modified Independent                        OT Problem List: Decreased strength;Decreased range of motion;Decreased activity tolerance;Impaired balance (sitting and/or standing);Decreased knowledge of use of DME or AE;Decreased safety awareness      OT Treatment/Interventions:      OT Goals(Current goals can be found in the  care plan section) Acute Rehab OT Goals Patient Stated Goal: to go home OT Goal Formulation: With patient Time For Goal Achievement: 08/30/21 Potential to Achieve Goals: Good  OT Frequency:      Co-evaluation              AM-PAC OT "6 Clicks" Daily Activity     Outcome Measure Help from another person eating meals?: None Help from another person taking care of personal grooming?: None Help from another person toileting, which includes using toliet, bedpan, or urinal?: A Little Help from another person bathing (including washing, rinsing, drying)?: A Little Help from another person to put on and taking off regular upper body clothing?: None Help from another person to put on and taking off regular lower body clothing?: A Little 6 Click Score: 21   End of Session Equipment Utilized During Treatment: Gait belt;Cervical collar Nurse Communication: Mobility status  Activity Tolerance: Patient tolerated treatment well Patient left: in bed;with call bell/phone within reach;Other (comment) (sitting EOB)  OT Visit Diagnosis: Unsteadiness on feet (R26.81);Other abnormalities of gait and mobility (R26.89)                Time: 0272-5366 OT Time Calculation (min): 23 min Charges:  OT General Charges $OT Visit: 1 Visit OT Evaluation $OT Eval Low Complexity: 1 Low OT Treatments $Self Care/Home Management : 8-22 mins Lynnda Child, OTD, OTR/L Acute  Rehab [phone]  Kaylyn Lim 08/16/2021, 9:02 AM

## 2021-08-17 NOTE — Anesthesia Postprocedure Evaluation (Signed)
Anesthesia Post Note  Patient: MALAISHA SILLIMAN  Procedure(s) Performed: Anterior Cervical Decompression Discectomy Fusion, Interbody Prosthesis, Plate and Screws, Cervical three-four, Cervical four-five, Cervical five six (Neck)     Anesthesia Post Evaluation No notable events documented.  Last Vitals:  Vitals:   08/16/21 0417 08/16/21 0741  BP: (!) 153/71 134/67  Pulse: 77 82  Resp: 16 18  Temp: 36.6 C 36.6 C  SpO2: 97% 98%    Last Pain:  Vitals:   08/16/21 1030  TempSrc:   PainSc: 4                  Rosario Kushner

## 2022-04-08 IMAGING — CR DG CERVICAL SPINE 2 OR 3 VIEWS
2 series · 2 of 2 positions shown · non-contrast
Comparison: None.

CLINICAL DATA: C3-C6 ACDF localization films.

EXAM:
CERVICAL SPINE - 2-3 VIEW

[xtable lateral (1 of 2)]
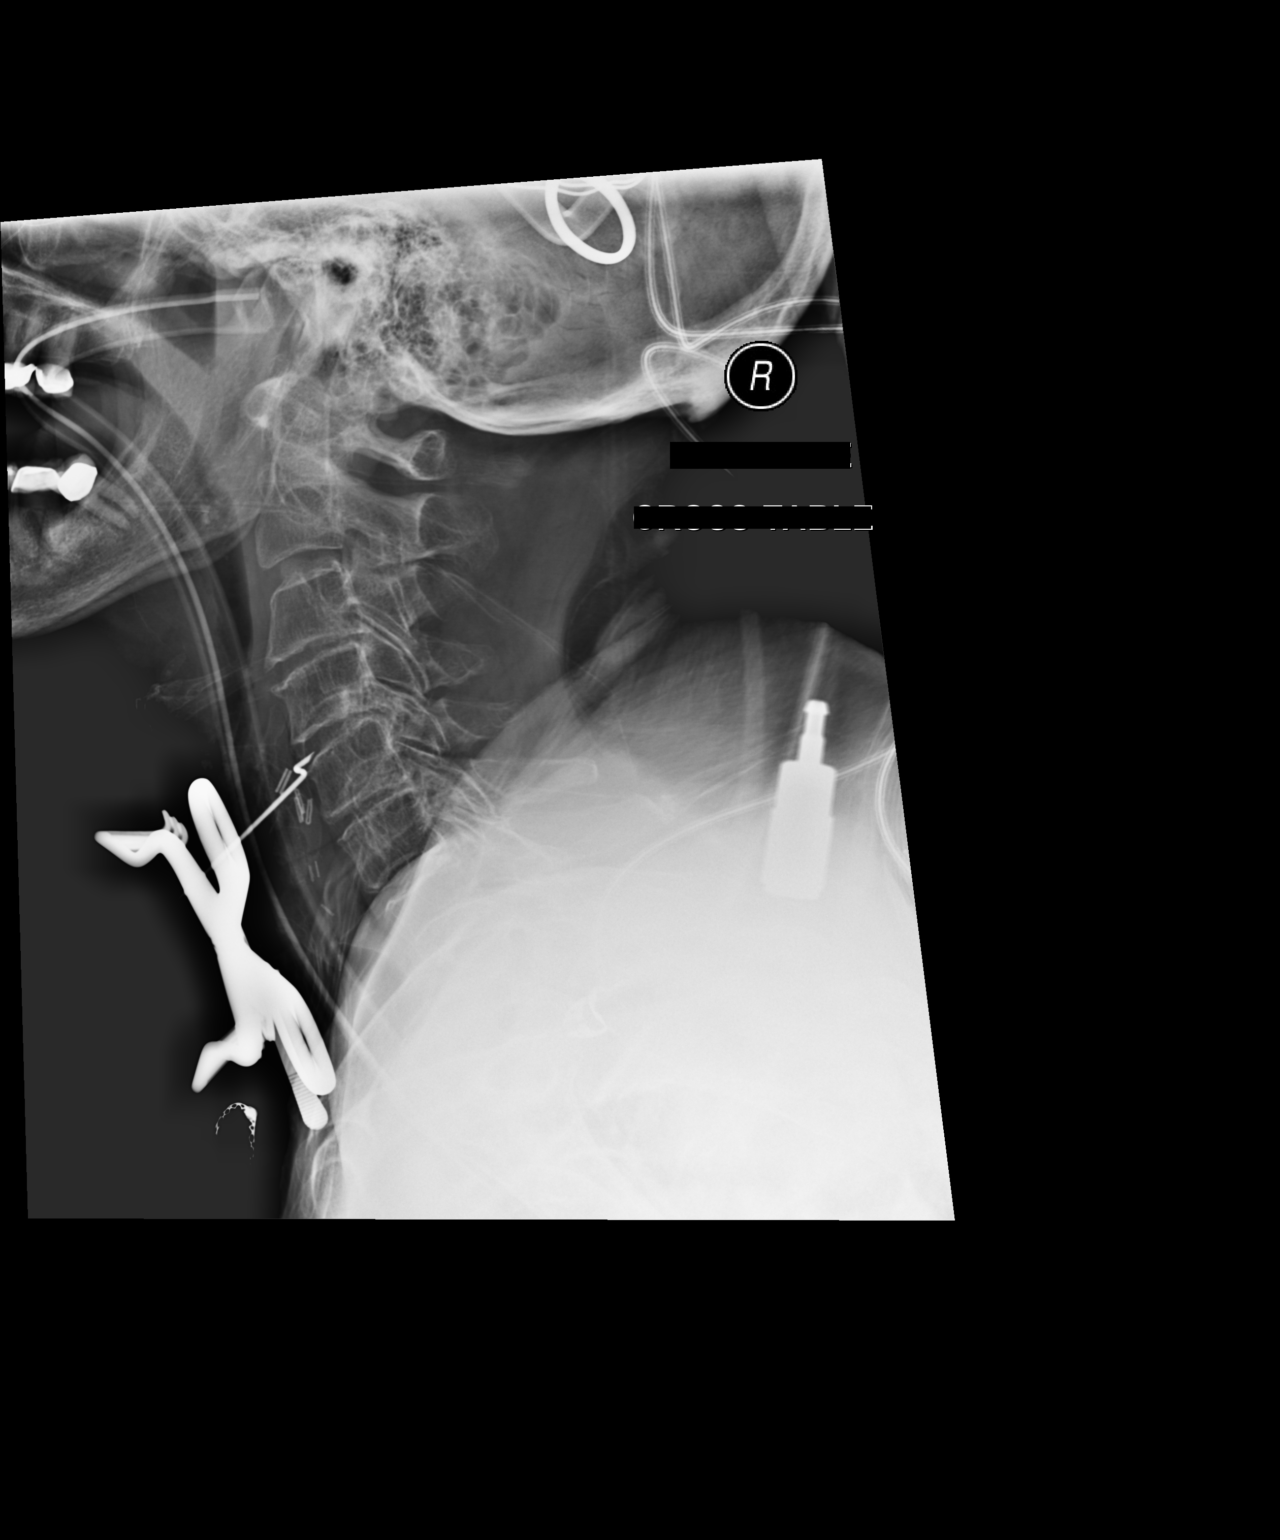

[xtable lateral (2 of 2)]
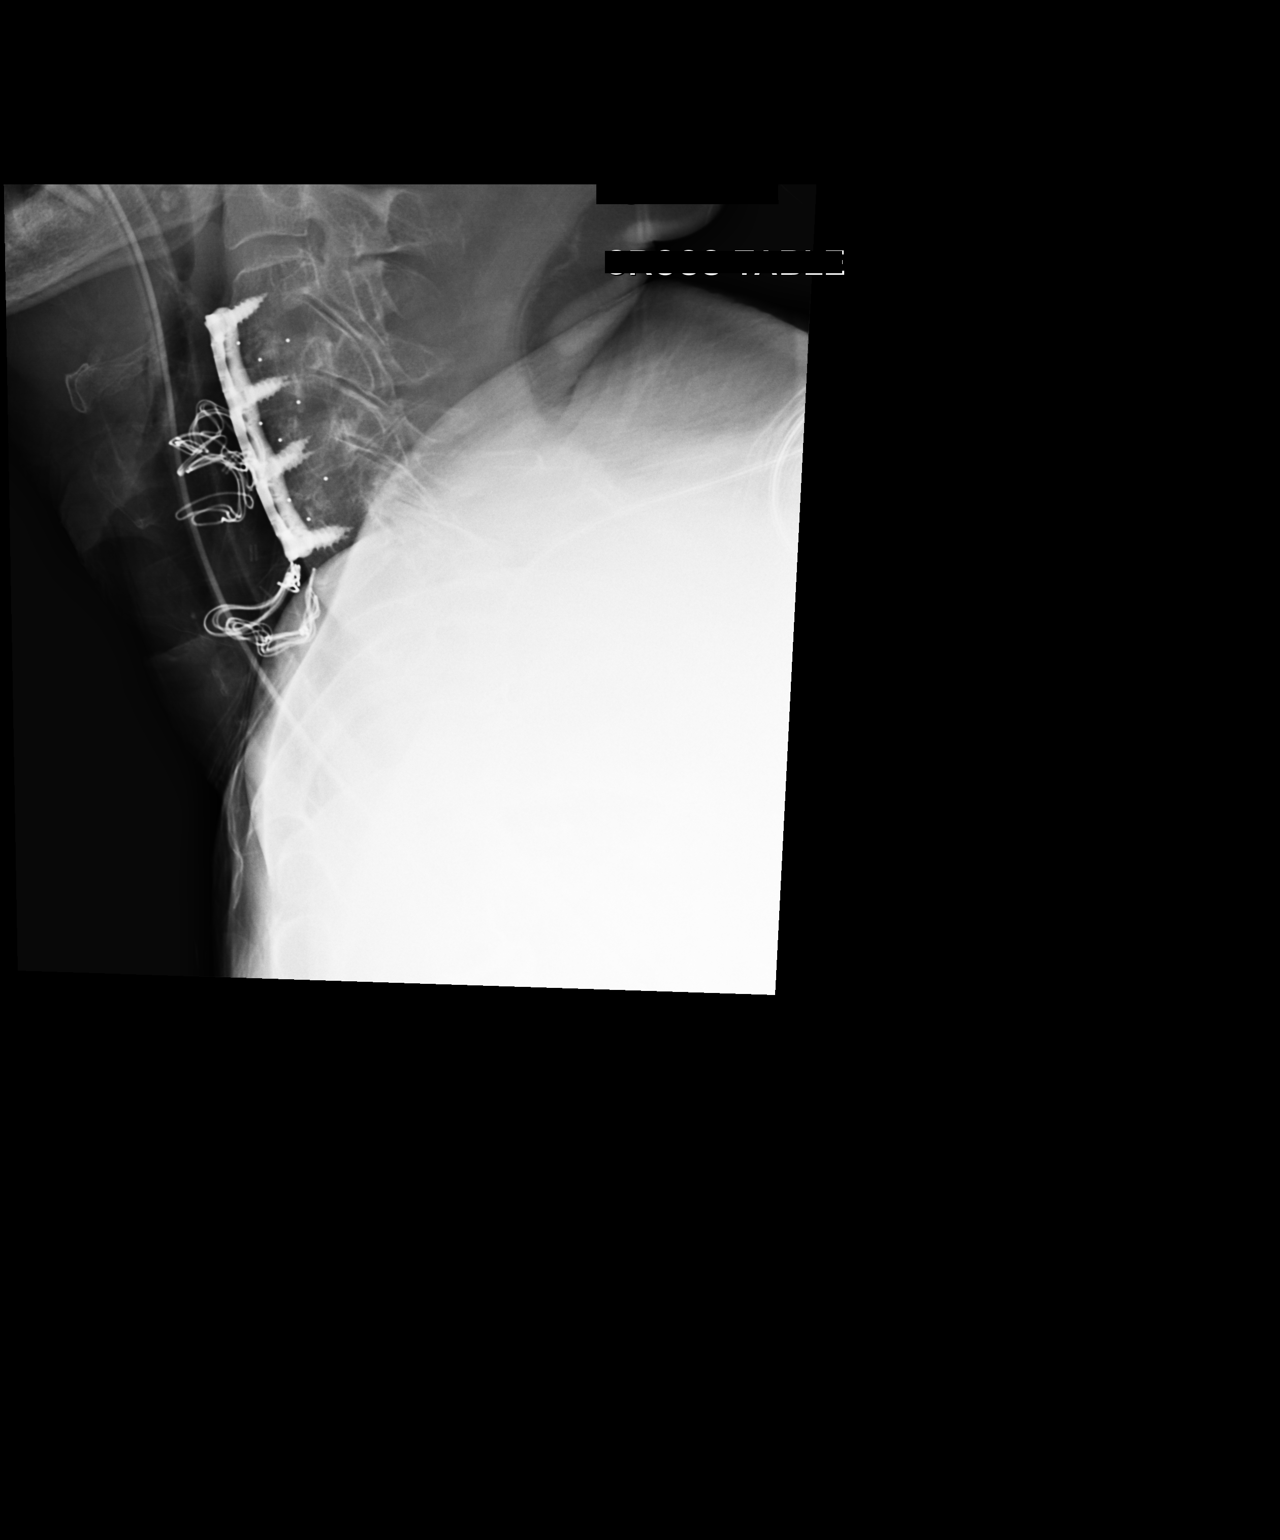

[2 of 2 positions shown; findings below may reference images not displayed]

FINDINGS: Two portable cross-table lateral views of the cervical spine
obtained in the operating room. Image time stamped 2626 hour
demonstrates surgical instrument at the anterior aspect of the C4-C5
disc space. Image time stamped at 3073 hour demonstrates anterior
fusion from C3 through C5 with interbody spacers.
IMPRESSION: Cross-table lateral views of the cervical spine obtained during
anterior C3 through C6 anterior fusion.

## 2023-03-13 ENCOUNTER — Other Ambulatory Visit: Payer: Self-pay

## 2023-03-13 ENCOUNTER — Emergency Department (HOSPITAL_COMMUNITY): Payer: Medicare PPO

## 2023-03-13 ENCOUNTER — Emergency Department (HOSPITAL_COMMUNITY)
Admission: EM | Admit: 2023-03-13 | Discharge: 2023-03-13 | Disposition: A | Discharge status: Home / Self Care | Payer: Medicare PPO | Attending: Emergency Medicine | Admitting: Emergency Medicine

## 2023-03-13 DIAGNOSIS — Z79899 Other long term (current) drug therapy: Secondary | ICD-10-CM | POA: Diagnosis not present

## 2023-03-13 DIAGNOSIS — M79602 Pain in left arm: Secondary | ICD-10-CM | POA: Diagnosis present

## 2023-03-13 DIAGNOSIS — R202 Paresthesia of skin: Secondary | ICD-10-CM

## 2023-03-13 DIAGNOSIS — Z853 Personal history of malignant neoplasm of breast: Secondary | ICD-10-CM | POA: Insufficient documentation

## 2023-03-13 DIAGNOSIS — M50123 Cervical disc disorder at C6-C7 level with radiculopathy: Secondary | ICD-10-CM | POA: Diagnosis not present

## 2023-03-13 DIAGNOSIS — I10 Essential (primary) hypertension: Secondary | ICD-10-CM | POA: Diagnosis not present

## 2023-03-13 LAB — CBC
HCT: 41.8 % (ref 36.0–46.0)
Hemoglobin: 13.1 g/dL (ref 12.0–15.0)
MCH: 29.7 pg (ref 26.0–34.0)
MCHC: 31.3 g/dL (ref 30.0–36.0)
MCV: 94.8 fL (ref 80.0–100.0)
Platelets: 280 10*3/uL (ref 150–400)
RBC: 4.41 MIL/uL (ref 3.87–5.11)
RDW: 13.6 % (ref 11.5–15.5)
WBC: 5.2 10*3/uL (ref 4.0–10.5)
nRBC: 0 % (ref 0.0–0.2)

## 2023-03-13 LAB — BASIC METABOLIC PANEL
Anion gap: 12 (ref 5–15)
BUN: 13 mg/dL (ref 8–23)
CO2: 22 mmol/L (ref 22–32)
Calcium: 9.1 mg/dL (ref 8.9–10.3)
Chloride: 101 mmol/L (ref 98–111)
Creatinine, Ser: 0.98 mg/dL (ref 0.44–1.00)
GFR, Estimated: 60 mL/min — ABNORMAL LOW (ref >60)
Glucose, Bld: 108 mg/dL — ABNORMAL HIGH (ref 70–99)
Potassium: 3.9 mmol/L (ref 3.5–5.1)
Sodium: 135 mmol/L (ref 135–145)

## 2023-03-13 LAB — TROPONIN I (HIGH SENSITIVITY): Troponin I (High Sensitivity): 9 ng/L (ref <18)

## 2023-03-13 MED ORDER — TRAMADOL HCL 50 MG PO TABS: 50.0000 mg | ORAL_TABLET | Freq: Four times a day (QID) | ORAL | 0 refills | Status: AC | PRN

## 2023-03-13 MED ORDER — ONDANSETRON HCL 4 MG/2ML IJ SOLN
4.0000 mg | Freq: Once | INTRAMUSCULAR | Status: AC
  Administered 2023-03-13: 4 mg via INTRAVENOUS
  Filled 2023-03-13: qty 2

## 2023-03-13 MED ORDER — TRAMADOL HCL 50 MG PO TABS
50.0000 mg | ORAL_TABLET | Freq: Once | ORAL | Status: AC
  Administered 2023-03-13: 50 mg via ORAL
  Filled 2023-03-13: qty 1

## 2023-03-13 MED ORDER — LORAZEPAM 1 MG PO TABS
0.5000 mg | ORAL_TABLET | Freq: Once | ORAL | Status: AC
Start: 2023-03-13 — End: 2023-03-13
  Administered 2023-03-13: 0.5 mg via ORAL
  Filled 2023-03-13: qty 1

## 2023-03-13 MED ORDER — MORPHINE SULFATE (PF) 4 MG/ML IV SOLN
4.0000 mg | Freq: Once | INTRAVENOUS | Status: AC
  Administered 2023-03-13: 4 mg via INTRAVENOUS
  Filled 2023-03-13: qty 1

## 2023-03-13 NOTE — Discharge Instructions (Addendum)
I have sent a prescription of Tramadol to your pharmacy. You can take this up to every 6 hours as needed for pain. Follow up with your orthopedic surgeon, Dr. Lovell Sheehan, for further evaluation and management of your symptoms.  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  If symptoms change or worsen please return to ER.

## 2023-03-13 NOTE — ED Provider Notes (Signed)
[place] EMERGENCY DEPARTMENT AT [facility] Provider Note   CSN: [id number] Arrival date & time: 03/13/23  1018     History  Chief Complaint  Patient presents with   Arm Pain    Susan Ali is a 77 y.o. female with a history of hypertension, breast cancer, and anterior cervical disc fusion who presents to the ED today with left arm pain.  Patient reports pain to her left arm and scapula that began yesterday.  Reports pain throughout her whole left arm with some weakness to her left hand.  No loss of strength or impaired range of motion.  Denies trauma or injury to the arm.  No shortness of breath, chest pain, or tearing pain.  No pain in the neck or back.  Patient had surgery to her cervical spine and 2023 with Dr. Lovell Sheehan for cervical spondylosis with myelopathy and radiculopathy.  Patient reports that the pain that she has in her arm now is the same pain that she had prior to her neck surgery.  She did not have any neck pain prior to the surgery either.    Home Medications Prior to Admission medications   Medication Sig Start Date End Date Taking? Authorizing Provider  Apoaequorin (PREVAGEN) 10 MG CAPS Take 10 mg by mouth daily.    [provider]  BLACK ELDERBERRY PO Take 1 each by mouth daily.    [provider]  docusate sodium (COLACE) 100 MG capsule Take 1 capsule (100 mg total) by mouth 2 (two) times daily. 08/16/21   Tressie Stalker, MD  hydrochlorothiazide (HYDRODIURIL) 25 MG tablet Take 25 mg by mouth daily. 05/17/20   [provider]  HYDROcodone-acetaminophen (NORCO/VICODIN) 5-325 MG tablet Take 1-2 tablets by mouth every 4 (four) hours as needed for moderate pain or severe pain. 08/16/21   Tressie Stalker, MD  latanoprost (XALATAN) 0.005 % ophthalmic solution 1 drop at bedtime.    [provider]  losartan (COZAAR) 25 MG tablet Take 25 mg by mouth daily. 05/17/20   [provider]  Propylene Glycol (SYSTANE  BALANCE) 0.6 % SOLN Place 1 drop into both eyes daily as needed (dry eyes).    [provider]  timolol (TIMOPTIC) 0.5 % ophthalmic solution Place 1 drop into both eyes daily.    [provider]      Allergies    Patient has no known allergies.    Review of Systems   Review of Systems  Musculoskeletal:        Left arm pain  All other systems reviewed and are negative.   Physical Exam Updated Vital Signs BP 108/62 (BP Location: Right Arm)   Pulse (!) 50   Temp 97.7 F (36.5 C) (Oral)   Resp 16   SpO2 100%  Physical Exam Vitals and nursing note reviewed.  Constitutional:      Appearance: Normal appearance.  HENT:     Head: Normocephalic and atraumatic.     Mouth/Throat:     Mouth: Mucous membranes are moist.  Eyes:     Conjunctiva/sclera: Conjunctivae normal.     Pupils: Pupils are equal, round, and reactive to light.  Cardiovascular:     Rate and Rhythm: Normal rate and regular rhythm.     Pulses: Normal pulses.     Heart sounds: Normal heart sounds.  Pulmonary:     Effort: Pulmonary effort is normal.     Breath sounds: Normal breath sounds.  Abdominal:     Palpations: Abdomen  is soft.     Tenderness: There is no abdominal tenderness.  Musculoskeletal:        General: Tenderness present. No swelling or signs of injury. Normal range of motion.     Right lower leg: No edema.     Left lower leg: No edema.     Comments: Tenderness to palpation of left scapula and arm.  Range of motion, strength, and sensation of the left upper extremity intact.  Skin:    General: Skin is warm and dry.     Findings: No rash.  Neurological:     General: No focal deficit present.     Mental Status: She is alert.  Psychiatric:        Mood and Affect: Mood normal.        Behavior: Behavior normal.     ED Results / Procedures / Treatments   Labs (all labs ordered are listed, but only abnormal results are displayed) Labs Reviewed  BASIC METABOLIC PANEL - Abnormal;  Notable for the following components:      Result Value   Glucose, Bld 108 (*)    GFR, Estimated 60 (*)    All other components within normal limits  CBC  TROPONIN I (HIGH SENSITIVITY)    EKG EKG Interpretation Date/Time:  Tuesday March 13 2023 10:13:55 EDT Ventricular Rate:  53 PR Interval:  172 QRS Duration:  78 QT Interval:  456 QTC Calculation: 427 R Axis:   35  Text Interpretation: Sinus bradycardia Nonspecific ST and T wave abnormality Abnormal ECG When compared with ECG of 19-Jul-2021 09:48, PREVIOUS ECG IS PRESENT when compared to prior, overall similar appearance. No STEMI Confirmed by Theda Belfast ([id number]) on 03/13/2023 12:09:56 PM  Radiology DG Chest 2 View  Result Date: 03/13/2023 CLINICAL DATA:  Chest pain. EXAM: CHEST - 2 VIEW COMPARISON:  June 24, 2012. FINDINGS: The heart size and mediastinal contours are within normal limits. Mild right basilar subsegmental atelectasis or infiltrate is noted. Minimal left basilar subsegmental atelectasis or scarring is noted. The visualized skeletal structures are unremarkable. IMPRESSION: Mild right basilar subsegmental atelectasis or infiltrate is noted. Minimal left basilar subsegmental atelectasis or scarring. Electronically Signed   By: Lupita Raider M.D.   On: 03/13/2023 12:40    Procedures Procedures: not indicated.   Medications Ordered in ED Medications  morphine (PF) 4 MG/ML injection 4 mg (4 mg Intravenous Given 03/13/23 1240)  ondansetron (ZOFRAN) injection 4 mg (4 mg Intravenous Given 03/13/23 1240)  LORazepam (ATIVAN) tablet 0.5 mg (0.5 mg Oral Given 03/13/23 1240)    ED Course/ Medical Decision Making/ A&P                                 Medical Decision Making Amount and/or Complexity of Data Reviewed Labs: ordered. Radiology: ordered.  Risk Prescription drug management.   This patient presents to the ED for concern of left arm pain, this involves an extensive number of treatment options, and is a  complaint that carries with it a high risk of complications and morbidity.   Differential diagnosis includes: ACS, radiculopathy, ligamentous injury, contusion, muscle strain, etc.   Comorbidities  See HPI above   Additional History  Additional history obtained from patient's previous records.   Cardiac Monitoring / EKG  The patient was maintained on a cardiac monitor.  I personally viewed and interpreted the cardiac monitored which showed: Sinus bradycardia with a heart rate of  53 bpm.  EKG was unchanged from prior.   Lab Tests  I ordered and personally interpreted labs.  The pertinent results include:   BMP and CBC are within normal limits - no acute infection, anemia, electrolyte abnormality, or AKI Troponin is negative   Imaging Studies  I ordered imaging studies including MRI cervical spine  I independently visualized and interpreted imaging which showed: pending at shift change. I agree with the radiologist interpretation   Problem List / ED Course / Critical Interventions / Medication Management  Left arm and shoulder pain I ordered medications including: Morphine and Zofran for pain Ativan to anxiety prior to MRI Reevaluation of the patient after these medicines showed that the patient improved I have reviewed the patients home medicines and have made adjustments as needed   Social Determinants of Health  Tobacco use   Test / Admission - Considered  Care handed off to oncoming PA at shift change.       Final Clinical Impression(s) / ED Diagnoses Final diagnoses:  None    Rx / DC Orders ED Discharge Orders     None         Maxwell Marion, PA-C 03/13/23 1521    Tegeler, Canary Brim, MD 03/13/23 [phone]

## 2023-03-13 NOTE — ED Triage Notes (Signed)
Pt arrives via [facility] from doctor's office. Pt reports that last night she began having L arm pain and pain in her back. Pt denies CP, SOB, N/V, diaphoresis. Was given two SL NTG PTA  and 324 mg ASA.

## 2023-03-13 NOTE — ED Provider Notes (Signed)
Patient given in sign out by Maxwell Marion, PA-C.  Please review their note for patient HPI, physical exam, workup.  At this time the plan is follow-up on MR and dispo accordingly.  MR cervical spine shows at the C6-C7 level the patient has moderate to severe left and mild right foraminal stenosis with mild canal stenosis which is a progression from previous MR.  This would explain patient's symptoms and I spoke to the patient about these results and patient states that she wants to be discharged and follow-up with her neurosurgeon Dr. Lovell Sheehan in the outpatient setting which is a reasonable request at this time.  Patient will be given tramadol for her pain as she has had this in the past to help with pain related to her cervical spine.  Patient stable for discharge.  Patient verbalized understanding and acceptance of this plan.    Susan Corrigan, PA-C 03/13/23 1631    Ali, Susan Brim, MD 03/14/23 [phone]

## 2023-03-13 NOTE — ED Notes (Signed)
Pt transported to MRI at this time 

## 2023-10-26 ENCOUNTER — Other Ambulatory Visit (HOSPITAL_COMMUNITY): Payer: Self-pay | Admitting: Student

## 2023-10-26 DIAGNOSIS — R110 Nausea: Secondary | ICD-10-CM

## 2023-11-09 ENCOUNTER — Encounter (HOSPITAL_COMMUNITY)
Admission: RE | Admit: 2023-11-09 | Discharge: 2023-11-09 | Disposition: A | Discharge status: Home / Self Care | Source: Ambulatory Visit | Attending: Student

## 2023-11-09 DIAGNOSIS — R110 Nausea: Secondary | ICD-10-CM | POA: Diagnosis present

## 2023-11-09 MED ORDER — TECHNETIUM TC 99M SULFUR COLLOID
2.0000 | Freq: Once | INTRAVENOUS | Status: AC
  Administered 2023-11-09: 2.17 via ORAL

## 2024-06-18 ENCOUNTER — Ambulatory Visit: Attending: Cardiology | Admitting: Cardiology

## 2024-06-18 ENCOUNTER — Encounter: Payer: Self-pay | Admitting: Cardiology

## 2024-06-18 ENCOUNTER — Other Ambulatory Visit (HOSPITAL_COMMUNITY): Payer: Self-pay

## 2024-06-18 VITALS — BP 152/90 | HR 71 | Ht 65.0 in | Wt 170.6 lb

## 2024-06-18 DIAGNOSIS — R42 Dizziness and giddiness: Secondary | ICD-10-CM | POA: Insufficient documentation

## 2024-06-18 DIAGNOSIS — I10 Essential (primary) hypertension: Secondary | ICD-10-CM

## 2024-06-18 DIAGNOSIS — I493 Ventricular premature depolarization: Secondary | ICD-10-CM | POA: Diagnosis not present

## 2024-06-18 DIAGNOSIS — I4729 Other ventricular tachycardia: Secondary | ICD-10-CM | POA: Diagnosis not present

## 2024-06-18 MED ORDER — CARVEDILOL 6.25 MG PO TABS
6.2500 mg | ORAL_TABLET | Freq: Two times a day (BID) | ORAL | 2 refills | Status: DC
  Filled 2024-06-18: qty 180, 90d supply, fill #0

## 2024-06-18 NOTE — Patient Instructions (Signed)
 Medication Instructions:  START Coreg  6.25 mg twice daily   *If you need a refill on your cardiac medications before your next appointment, please call your pharmacy*  Testing/Procedures: Echocardiogram   Your physician has requested that you have an echocardiogram. Echocardiography is a painless test that uses sound waves to create images of your heart. It provides your doctor with information about the size and shape of your heart and how well your heart's chambers and valves are working. This procedure takes approximately one hour. There are no restrictions for this procedure. Please do NOT wear cologne, perfume, aftershave, or lotions (deodorant is allowed). Please arrive 15 minutes prior to your appointment time.  Please note: We ask at that you not bring children with you during ultrasound (echo/ vascular) testing. Due to room size and safety concerns, children are not allowed in the ultrasound rooms during exams. Our front office staff cannot provide observation of children in our lobby area while testing is being conducted. An adult accompanying a patient to their appointment will only be allowed in the ultrasound room at the discretion of the ultrasound technician under special circumstances. We apologize for any inconvenience.   Follow-Up: At [facility], you and your health needs are our priority.  As part of our continuing mission to provide you with exceptional heart care, our providers are all part of one team.  This team includes your primary Cardiologist (physician) and Advanced Practice Providers or APPs (Physician Assistants and Nurse Practitioners) who all work together to provide you with the care you need, when you need it.  Your next appointment:   8-12 week(s)  Provider:   Newman JINNY Lawrence, MD

## 2024-06-18 NOTE — Progress Notes (Signed)
 Cardiology Office Note:  .   Date:  06/18/2024  ID:  Susan Ali, DOB [birth year], MRN [id number] PCP: Loring Tanda Mae, MD  [facility] Providers Cardiologist:  Newman Lawrence, MD PCP: Loring Tanda Mae, MD  Chief Complaint  Patient presents with   Hypertension     Susan Ali is a 78 y.o. female with hypertension, NSVT, PVC, dizziness, dyspnea on exertion  Discussed the use of AI scribe software for clinical note transcription with the patient, who gave verbal consent to proceed.  History of Present Illness Susan Ali is a 78 year old female with hypertension who presents with dizziness and episodes of ventricular tachycardia. She was referred by her PA for evaluation of dizziness and heart monitor findings.  She has dizziness when walking, with swaying, which improved with physical therapy but persists when moving from lying to standing. She occasionally has brief, clear episodes of dizziness while sitting. She denies syncope and chest pain.  A heart monitor last spring showed ventricular tachycardia, PACs, and PVCs, though she does not have the tracings. She has shortness of breath with exertion such as walking up her driveway or climbing stairs.  She takes losartan  25 mg and hydrochlorothiazide  for blood pressure. Home blood pressures are usually in the 130s to 140s and lower than in clinic.  Hip arthritis and back problems limit her activity, though she uses a bicycle and stretching to stay active and would like to resume walking.  Her father had a stroke in his fifties. She quit smoking in 1993 and does not currently use tobacco or alcohol .      Vitals:   06/18/24 0828  BP: (!) 152/90  Pulse: 71  SpO2: 98%      Review of Systems  Cardiovascular:  Negative for chest pain, dyspnea on exertion, leg swelling, palpitations and syncope.  Neurological:  Positive for dizziness.        Studies Reviewed: SABRA        EKG  06/18/2024: Sinus rhythm with frequent Premature ventricular complexes Nonspecific ST and T wave abnormality When compared with ECG of 13-Mar-2023 10:13, Premature ventricular complexes are now Present  External monitor report 11/2023: 11 beat V. tach, PACs, PVCs   Physical Exam Vitals and nursing note reviewed.  Constitutional:      General: She is not in acute distress. Neck:     Vascular: No JVD.  Cardiovascular:     Rate and Rhythm: Normal rate and regular rhythm. Occasional Extrasystoles are present.    Heart sounds: Normal heart sounds. No murmur heard. Pulmonary:     Effort: Pulmonary effort is normal.     Breath sounds: Normal breath sounds. No wheezing or rales.  Musculoskeletal:     Right lower leg: No edema.     Left lower leg: No edema.      VISIT DIAGNOSES:   ICD-10-CM   1. Primary hypertension  I10 EKG 12-Lead    ECHOCARDIOGRAM COMPLETE    2. Dizziness  R42 EKG 12-Lead    3. NSVT (nonsustained ventricular tachycardia) (HCC)  I47.29     4. PVC (premature ventricular contraction)  I49.3        Susan Ali is a 78 y.o. female with hypertension, NSVT, PVC, dizziness, dyspnea on exertion  Assessment & Plan Hypertension: Uncontrolled.  Currently on losartan  25 mg daily, hydrochlorothiazide  25 mg daily. Uncontrolled hypertension most likely etiology of her dizziness. Given her recent monitor that showed NSVT and multiple PVCs, as well  as EKG today with frequent PVCs, I would add a beta-blocker carvedilol  6.25 mg twice daily. If blood pressure remains uncontrolled, could increase losartan  to up to 100 mg daily as tolerated.  NSVT: Noted on monitor report in 11/2023.  Monitor tracing not available for my review. Given this finding, as well as her exertional dyspnea, recommend echocardiogram. If EF is reduced, may need further evaluation for ischemia.    Meds ordered this encounter  Medications   carvedilol  (COREG ) 6.25 MG tablet    Sig: Take 1  tablet (6.25 mg total) by mouth 2 (two) times daily.    Dispense:  180 tablet    Refill:  2     F/u in 3 months  Signed, Newman JINNY Lawrence, MD

## 2024-07-22 ENCOUNTER — Ambulatory Visit: Attending: Cardiology

## 2024-07-22 DIAGNOSIS — I10 Essential (primary) hypertension: Secondary | ICD-10-CM | POA: Diagnosis not present

## 2024-07-23 LAB — ECHOCARDIOGRAM COMPLETE
Area-P 1/2: 4.17 cm2
MV M vel: 6.11 m/s
MV Peak grad: 149.3 mmHg
Radius: 0.2 cm
S' Lateral: 3.5 cm

## 2024-07-24 ENCOUNTER — Ambulatory Visit: Payer: Self-pay | Admitting: Cardiology

## 2024-07-24 NOTE — Progress Notes (Signed)
 Heart function normal but GLS (another marker of heart function) mildly reduced. Miltral valve at least moderately leaky. If shortness of breath persists, can consider further workup for evaluation of leaky mitral valve. Can discuss at the upcoming office visit in early February.  Thanks MJP

## 2024-07-28 NOTE — Telephone Encounter (Signed)
"  Patient is returning call  "

## 2024-08-04 ENCOUNTER — Ambulatory Visit (HOSPITAL_COMMUNITY)

## 2024-08-28 ENCOUNTER — Ambulatory Visit: Admitting: Cardiology

## 2024-08-28 ENCOUNTER — Encounter: Payer: Self-pay | Admitting: Cardiology

## 2024-08-28 VITALS — BP 134/73 | HR 62 | Resp 16 | Ht 65.0 in | Wt 177.0 lb

## 2024-08-28 DIAGNOSIS — I4729 Other ventricular tachycardia: Secondary | ICD-10-CM | POA: Diagnosis not present

## 2024-08-28 DIAGNOSIS — I493 Ventricular premature depolarization: Secondary | ICD-10-CM

## 2024-08-28 DIAGNOSIS — I10 Essential (primary) hypertension: Secondary | ICD-10-CM | POA: Diagnosis not present

## 2024-08-28 DIAGNOSIS — I340 Nonrheumatic mitral (valve) insufficiency: Secondary | ICD-10-CM | POA: Diagnosis not present

## 2024-08-28 MED ORDER — CARVEDILOL 6.25 MG PO TABS: 6.2500 mg | ORAL_TABLET | Freq: Two times a day (BID) | ORAL | 2 refills | Status: AC

## 2024-08-28 NOTE — Patient Instructions (Signed)
" °  Testing/Procedures: ECHOCARDIOGRAM IN 12/2024  Your physician has requested that you have an echocardiogram. Echocardiography is a painless test that uses sound waves to create images of your heart. It provides your doctor with information about the size and shape of your heart and how well your hearts chambers and valves are working. This procedure takes approximately one hour. There are no restrictions for this procedure. Please do NOT wear cologne, perfume, aftershave, or lotions (deodorant is allowed). Please arrive 15 minutes prior to your appointment time.  Please note: We ask at that you not bring children with you during ultrasound (echo/ vascular) testing. Due to room size and safety concerns, children are not allowed in the ultrasound rooms during exams. Our front office staff cannot provide observation of children in our lobby area while testing is being conducted. An adult accompanying a patient to their appointment will only be allowed in the ultrasound room at the discretion of the ultrasound technician under special circumstances. We apologize for any inconvenience.   Follow-Up: At [facility], you and your health needs are our priority.  As part of our continuing mission to provide you with exceptional heart care, our providers are all part of one team.  This team includes your primary Cardiologist (physician) and Advanced Practice Providers or APPs (Physician Assistants and Nurse Practitioners) who all work together to provide you with the care you need, when you need it.  Your next appointment:   01/2025   Provider:   Newman JINNY Lawrence, MD    We recommend signing up for the patient portal called MyChart.  Sign up information is provided on this After Visit Summary.  MyChart is used to connect with patients for Virtual Visits (Telemedicine).  Patients are able to view lab/test results, encounter notes, upcoming appointments, etc.  Non-urgent messages can be sent to  your provider as well.   To learn more about what you can do with MyChart, go to [email or web address].   Other Instructions          "

## 2024-08-28 NOTE — Progress Notes (Signed)
 " Cardiology Office Note:  .   Date:  08/28/2024  ID:  Susan Ali, DOB Jul 06, 1946, MRN [id number] PCP: Loring Tanda Mae, MD  [facility] Providers Cardiologist:  Newman Lawrence, MD PCP: Loring Tanda Mae, MD  Chief Complaint  Patient presents with   Mitral Regurgitation     Susan Ali is a 79 y.o. female with hypertension, mitral regurgitation, NSVT, PVC,   History of Present Illness Compared to last visit, her shortness of breath and palpitation symptoms are better.  Blood pressure is better controlled.  Reviewed recent echocardiogram results with patient, details below.      Vitals:   08/28/24 1314  BP: 134/73  Pulse: 62  Resp: 16  SpO2: 94%       Review of Systems  Cardiovascular:  Negative for chest pain, dyspnea on exertion, leg swelling, palpitations and syncope.        Studies Reviewed: SABRA        EKG 06/18/2024: Sinus rhythm with frequent Premature ventricular complexes Nonspecific ST and T wave abnormality When compared with ECG of 13-Mar-2023 10:13, Premature ventricular complexes are now Present  Echocardiogram 06/2024: 1. Left ventricular ejection fraction, by estimation, is 60 to 65%. The  left ventricle has normal function. The left ventricle has no regional  wall motion abnormalities. Left ventricular diastolic parameters are  consistent with Grade I diastolic dysfunction (impaired relaxation).  The average left ventricular global longitudinal strain is -15.2 %.  The global longitudinal strain is abnormal.   2. Right ventricular systolic function is normal. The right ventricular  size is normal.   3. Left atrial size was moderately dilated.   4. The mitral valve was not well visualized. Moderate mitral valve  regurgitation. No evidence of mitral stenosis. There is mild prolapse of  the posterior leaflet of the mitral valve. Suggest TEE for more accurate  evaluation if clinically indicated.   5. The aortic  valve is normal in structure. Aortic valve regurgitation is  not visualized. No aortic stenosis is present.   6. The inferior vena cava is normal in size with greater than 50%  respiratory variability, suggesting right atrial pressure of 3 mmHg.    External monitor report 11/2023: 11 beat V. tach, PACs, PVCs   Physical Exam Vitals and nursing note reviewed.  Constitutional:      General: She is not in acute distress. Neck:     Vascular: No JVD.  Cardiovascular:     Rate and Rhythm: Normal rate and regular rhythm. Occasional Extrasystoles are present.    Heart sounds: Murmur heard.     High-pitched blowing holosystolic murmur is present with a grade of 2/6 at the apex.  Pulmonary:     Effort: Pulmonary effort is normal.     Breath sounds: Normal breath sounds. No wheezing or rales.  Musculoskeletal:     Right lower leg: No edema.     Left lower leg: No edema.      VISIT DIAGNOSES:   ICD-10-CM   1. Nonrheumatic mitral valve regurgitation  I34.0 ECHOCARDIOGRAM COMPLETE    2. Primary hypertension  I10     3. PVC (premature ventricular contraction)  I49.3     4. NSVT (nonsustained ventricular tachycardia) (HCC)  I47.29         Susan Ali is a 79 y.o. female with hypertension, mitral regurgitation, NSVT, PVC,   Assessment & Plan Mitral regurgitation: Personally reviewed and independently interpreted the echocardiogram.  There is mild mitral prolapse with  mild to moderate mitral regurgitation. Recommend repeat transthoracic echocardiogram in 6 months.  I discussed TEE with the patient, but we will only recommend this if her symptoms of dyspnea are worse and/or surface echocardiogram shows worsening of mitral regurgitation.    Hypertension: Now well-controlled, continue current antihypertensive therapy.    NSVT: Palpitations better controlled, refilled carvedilol  6.25 mg twice daily.      Meds ordered this encounter  Medications   carvedilol  (COREG ) 6.25 MG  tablet    Sig: Take 1 tablet (6.25 mg total) by mouth 2 (two) times daily.    Dispense:  180 tablet    Refill:  2     F/u in 6 months  Signed, Newman JINNY Lawrence, MD  "

## 2024-12-23 ENCOUNTER — Ambulatory Visit
